# Patient Record
Sex: Female | Born: 1950 | Race: White | Hispanic: No | Marital: Married | Smoking: Never smoker
Health system: Southern US, Community
[De-identification: ages and names within clinical notes are randomized; demographics above are authoritative.]

## PROBLEM LIST (undated history)

## (undated) DIAGNOSIS — F329 Major depressive disorder, single episode, unspecified: Secondary | ICD-10-CM

## (undated) DIAGNOSIS — M8580 Other specified disorders of bone density and structure, unspecified site: Secondary | ICD-10-CM

## (undated) DIAGNOSIS — M19022 Primary osteoarthritis, left elbow: Secondary | ICD-10-CM

## (undated) DIAGNOSIS — T7840XA Allergy, unspecified, initial encounter: Secondary | ICD-10-CM

## (undated) DIAGNOSIS — L509 Urticaria, unspecified: Secondary | ICD-10-CM

## (undated) DIAGNOSIS — F419 Anxiety disorder, unspecified: Secondary | ICD-10-CM

## (undated) DIAGNOSIS — F32A Depression, unspecified: Secondary | ICD-10-CM

## (undated) DIAGNOSIS — D649 Anemia, unspecified: Secondary | ICD-10-CM

## (undated) DIAGNOSIS — L309 Dermatitis, unspecified: Secondary | ICD-10-CM

## (undated) DIAGNOSIS — K219 Gastro-esophageal reflux disease without esophagitis: Secondary | ICD-10-CM

## (undated) DIAGNOSIS — Z9889 Other specified postprocedural states: Secondary | ICD-10-CM

## (undated) DIAGNOSIS — E785 Hyperlipidemia, unspecified: Secondary | ICD-10-CM

## (undated) DIAGNOSIS — G510 Bell's palsy: Secondary | ICD-10-CM

## (undated) DIAGNOSIS — N83209 Unspecified ovarian cyst, unspecified side: Secondary | ICD-10-CM

## (undated) DIAGNOSIS — R112 Nausea with vomiting, unspecified: Secondary | ICD-10-CM

## (undated) DIAGNOSIS — R011 Cardiac murmur, unspecified: Secondary | ICD-10-CM

## (undated) DIAGNOSIS — S2220XA Unspecified fracture of sternum, initial encounter for closed fracture: Secondary | ICD-10-CM

## (undated) HISTORY — PX: COLONOSCOPY: SHX174

## (undated) HISTORY — PX: POLYPECTOMY: SHX149

## (undated) HISTORY — DX: Gastro-esophageal reflux disease without esophagitis: K21.9

## (undated) HISTORY — PX: OTHER SURGICAL HISTORY: SHX169

## (undated) HISTORY — DX: Depression, unspecified: F32.A

## (undated) HISTORY — DX: Unspecified ovarian cyst, unspecified side: N83.209

## (undated) HISTORY — DX: Allergy, unspecified, initial encounter: T78.40XA

## (undated) HISTORY — DX: Anxiety disorder, unspecified: F41.9

## (undated) HISTORY — PX: ABDOMINAL HYSTERECTOMY: SHX81

## (undated) HISTORY — DX: Hyperlipidemia, unspecified: E78.5

## (undated) HISTORY — DX: Other specified disorders of bone density and structure, unspecified site: M85.80

## (undated) HISTORY — DX: Urticaria, unspecified: L50.9

## (undated) HISTORY — DX: Cardiac murmur, unspecified: R01.1

## (undated) HISTORY — PX: TONSILLECTOMY: SUR1361

## (undated) HISTORY — DX: Major depressive disorder, single episode, unspecified: F32.9

---

## 2001-11-24 DIAGNOSIS — F419 Anxiety disorder, unspecified: Secondary | ICD-10-CM

## 2001-11-24 HISTORY — DX: Anxiety disorder, unspecified: F41.9

## 2003-07-28 ENCOUNTER — Encounter: Payer: Self-pay | Admitting: Gastroenterology

## 2004-05-15 ENCOUNTER — Encounter: Payer: Self-pay | Admitting: Internal Medicine

## 2004-05-24 ENCOUNTER — Encounter: Admission: RE | Admit: 2004-05-24 | Discharge: 2004-05-24 | Payer: Self-pay | Admitting: Internal Medicine

## 2005-05-15 ENCOUNTER — Ambulatory Visit: Payer: Self-pay | Admitting: Internal Medicine

## 2005-05-26 ENCOUNTER — Encounter: Admission: RE | Admit: 2005-05-26 | Discharge: 2005-05-26 | Payer: Self-pay | Admitting: Internal Medicine

## 2005-12-06 ENCOUNTER — Emergency Department: Payer: Self-pay | Admitting: Emergency Medicine

## 2005-12-10 ENCOUNTER — Ambulatory Visit: Payer: Self-pay | Admitting: Internal Medicine

## 2005-12-16 ENCOUNTER — Ambulatory Visit: Payer: Self-pay | Admitting: Internal Medicine

## 2006-04-14 ENCOUNTER — Encounter: Payer: Self-pay | Admitting: Internal Medicine

## 2006-04-14 ENCOUNTER — Emergency Department (HOSPITAL_COMMUNITY): Admission: EM | Admit: 2006-04-14 | Discharge: 2006-04-14 | Payer: Self-pay | Admitting: Family Medicine

## 2006-04-28 ENCOUNTER — Ambulatory Visit: Payer: Self-pay | Admitting: Internal Medicine

## 2006-05-18 ENCOUNTER — Ambulatory Visit: Payer: Self-pay | Admitting: Internal Medicine

## 2006-07-07 ENCOUNTER — Encounter: Admission: RE | Admit: 2006-07-07 | Discharge: 2006-07-07 | Payer: Self-pay | Admitting: Internal Medicine

## 2006-08-20 ENCOUNTER — Ambulatory Visit: Payer: Self-pay | Admitting: Internal Medicine

## 2006-09-17 ENCOUNTER — Ambulatory Visit: Payer: Self-pay | Admitting: Internal Medicine

## 2006-11-10 ENCOUNTER — Ambulatory Visit: Payer: Self-pay | Admitting: Internal Medicine

## 2007-02-18 ENCOUNTER — Encounter: Payer: Self-pay | Admitting: Internal Medicine

## 2007-02-18 DIAGNOSIS — K219 Gastro-esophageal reflux disease without esophagitis: Secondary | ICD-10-CM | POA: Insufficient documentation

## 2007-02-18 DIAGNOSIS — M8580 Other specified disorders of bone density and structure, unspecified site: Secondary | ICD-10-CM | POA: Insufficient documentation

## 2007-02-18 DIAGNOSIS — L300 Nummular dermatitis: Secondary | ICD-10-CM | POA: Insufficient documentation

## 2007-02-18 DIAGNOSIS — F39 Unspecified mood [affective] disorder: Secondary | ICD-10-CM | POA: Insufficient documentation

## 2007-03-12 ENCOUNTER — Ambulatory Visit: Payer: Self-pay | Admitting: Internal Medicine

## 2007-08-11 ENCOUNTER — Encounter: Payer: Self-pay | Admitting: Internal Medicine

## 2007-08-17 ENCOUNTER — Encounter (INDEPENDENT_AMBULATORY_CARE_PROVIDER_SITE_OTHER): Payer: Self-pay | Admitting: *Deleted

## 2007-09-01 ENCOUNTER — Encounter (INDEPENDENT_AMBULATORY_CARE_PROVIDER_SITE_OTHER): Payer: Self-pay | Admitting: *Deleted

## 2007-09-15 ENCOUNTER — Ambulatory Visit: Payer: Self-pay | Admitting: Internal Medicine

## 2007-09-15 DIAGNOSIS — J309 Allergic rhinitis, unspecified: Secondary | ICD-10-CM | POA: Insufficient documentation

## 2007-09-15 DIAGNOSIS — R109 Unspecified abdominal pain: Secondary | ICD-10-CM | POA: Insufficient documentation

## 2007-09-16 ENCOUNTER — Encounter: Payer: Self-pay | Admitting: Internal Medicine

## 2007-09-16 LAB — CONVERTED CEMR LAB
ALT: 17 units/L (ref 0–35)
Amylase: 85 units/L (ref 27–131)
Basophils Absolute: 0 10*3/uL (ref 0.0–0.1)
Bilirubin, Direct: 0.1 mg/dL (ref 0.0–0.3)
Chloride: 105 meq/L (ref 96–112)
Creatinine, Ser: 0.7 mg/dL (ref 0.4–1.2)
Eosinophils Relative: 1.4 % (ref 0.0–5.0)
GFR calc Af Amer: 111 mL/min
GFR calc non Af Amer: 92 mL/min
Lipase: 22 units/L (ref 11.0–59.0)
Lymphocytes Relative: 33.4 % (ref 12.0–46.0)
MCHC: 34.6 g/dL (ref 30.0–36.0)
Monocytes Relative: 10.8 % (ref 3.0–11.0)
Neutro Abs: 3 10*3/uL (ref 1.4–7.7)
Phosphorus: 3.6 mg/dL (ref 2.3–4.6)
Platelets: 268 10*3/uL (ref 150–400)
Potassium: 4.8 meq/L (ref 3.5–5.1)
RBC: 3.94 M/uL (ref 3.87–5.11)
Total Bilirubin: 0.9 mg/dL (ref 0.3–1.2)

## 2007-10-26 ENCOUNTER — Telehealth (INDEPENDENT_AMBULATORY_CARE_PROVIDER_SITE_OTHER): Payer: Self-pay | Admitting: *Deleted

## 2008-02-29 ENCOUNTER — Ambulatory Visit: Payer: Self-pay | Admitting: Internal Medicine

## 2008-02-29 DIAGNOSIS — S20219A Contusion of unspecified front wall of thorax, initial encounter: Secondary | ICD-10-CM | POA: Insufficient documentation

## 2008-09-19 ENCOUNTER — Ambulatory Visit: Payer: Self-pay | Admitting: Internal Medicine

## 2008-09-19 DIAGNOSIS — M549 Dorsalgia, unspecified: Secondary | ICD-10-CM | POA: Insufficient documentation

## 2008-09-19 DIAGNOSIS — R002 Palpitations: Secondary | ICD-10-CM | POA: Insufficient documentation

## 2008-10-11 ENCOUNTER — Encounter: Payer: Self-pay | Admitting: Internal Medicine

## 2008-10-23 ENCOUNTER — Encounter (INDEPENDENT_AMBULATORY_CARE_PROVIDER_SITE_OTHER): Payer: Self-pay | Admitting: *Deleted

## 2009-01-31 ENCOUNTER — Telehealth (INDEPENDENT_AMBULATORY_CARE_PROVIDER_SITE_OTHER): Payer: Self-pay | Admitting: *Deleted

## 2009-04-03 ENCOUNTER — Telehealth: Payer: Self-pay | Admitting: Internal Medicine

## 2009-09-21 ENCOUNTER — Ambulatory Visit: Payer: Self-pay | Admitting: Internal Medicine

## 2009-09-21 DIAGNOSIS — E785 Hyperlipidemia, unspecified: Secondary | ICD-10-CM | POA: Insufficient documentation

## 2009-09-24 LAB — CONVERTED CEMR LAB
ALT: 19 units/L (ref 0–35)
AST: 18 units/L (ref 0–37)
Albumin: 4.1 g/dL (ref 3.5–5.2)
Alkaline Phosphatase: 60 units/L (ref 39–117)
Basophils Absolute: 0 10*3/uL (ref 0.0–0.1)
Bilirubin, Direct: 0 mg/dL (ref 0.0–0.3)
Chloride: 100 meq/L (ref 96–112)
Cholesterol: 338 mg/dL — ABNORMAL HIGH (ref 0–200)
Glucose, Bld: 97 mg/dL (ref 70–99)
HDL: 42.9 mg/dL (ref >39.00)
Hemoglobin: 13.6 g/dL (ref 12.0–15.0)
Lymphocytes Relative: 38.2 % (ref 12.0–46.0)
Monocytes Absolute: 0.5 10*3/uL (ref 0.1–1.0)
Neutro Abs: 2.6 10*3/uL (ref 1.4–7.7)
Neutrophils Relative %: 49.7 % (ref 43.0–77.0)
Phosphorus: 3.6 mg/dL (ref 2.3–4.6)
Sodium: 141 meq/L (ref 135–145)
TSH: 3.1 microintl units/mL (ref 0.35–5.50)
Total Protein: 7.8 g/dL (ref 6.0–8.3)
Triglycerides: 148 mg/dL (ref 0.0–149.0)
VLDL: 29.6 mg/dL (ref 0.0–40.0)
WBC: 5.1 10*3/uL (ref 4.5–10.5)

## 2009-10-24 ENCOUNTER — Encounter: Payer: Self-pay | Admitting: Internal Medicine

## 2009-11-01 ENCOUNTER — Encounter: Payer: Self-pay | Admitting: Internal Medicine

## 2009-12-06 ENCOUNTER — Telehealth: Payer: Self-pay | Admitting: Internal Medicine

## 2010-09-18 ENCOUNTER — Ambulatory Visit: Payer: Self-pay | Admitting: Internal Medicine

## 2010-09-18 DIAGNOSIS — T7840XA Allergy, unspecified, initial encounter: Secondary | ICD-10-CM | POA: Insufficient documentation

## 2010-09-23 ENCOUNTER — Ambulatory Visit: Payer: Self-pay | Admitting: Internal Medicine

## 2010-09-24 ENCOUNTER — Encounter (INDEPENDENT_AMBULATORY_CARE_PROVIDER_SITE_OTHER): Payer: Self-pay | Admitting: *Deleted

## 2010-09-24 LAB — CONVERTED CEMR LAB
AST: 18 units/L (ref 0–37)
Alkaline Phosphatase: 57 units/L (ref 39–117)
Basophils Absolute: 0 10*3/uL (ref 0.0–0.1)
Calcium: 9.9 mg/dL (ref 8.4–10.5)
Chloride: 95 meq/L — ABNORMAL LOW (ref 96–112)
Cholesterol: 303 mg/dL — ABNORMAL HIGH (ref 0–200)
Creatinine, Ser: 0.6 mg/dL (ref 0.4–1.2)
Direct LDL: 197.5 mg/dL
Eosinophils Relative: 1 % (ref 0.0–5.0)
GFR calc non Af Amer: 104.12 mL/min (ref >60)
Lymphs Abs: 2 10*3/uL (ref 0.7–4.0)
MCHC: 33.5 g/dL (ref 30.0–36.0)
Neutro Abs: 3.9 10*3/uL (ref 1.4–7.7)
Potassium: 4.6 meq/L (ref 3.5–5.1)
RBC: 3.98 M/uL (ref 3.87–5.11)
RDW: 13 % (ref 11.5–14.6)
Sodium: 139 meq/L (ref 135–145)
TSH: 2.59 microintl units/mL (ref 0.35–5.50)
Total Bilirubin: 0.6 mg/dL (ref 0.3–1.2)
Total CHOL/HDL Ratio: 7
Triglycerides: 371 mg/dL — ABNORMAL HIGH (ref 0.0–149.0)
VLDL: 74.2 mg/dL — ABNORMAL HIGH (ref 0.0–40.0)

## 2010-10-04 ENCOUNTER — Encounter (INDEPENDENT_AMBULATORY_CARE_PROVIDER_SITE_OTHER): Payer: Self-pay | Admitting: *Deleted

## 2010-10-08 ENCOUNTER — Ambulatory Visit: Payer: Self-pay | Admitting: Gastroenterology

## 2010-10-09 ENCOUNTER — Telehealth: Payer: Self-pay | Admitting: Gastroenterology

## 2010-10-14 ENCOUNTER — Telehealth (INDEPENDENT_AMBULATORY_CARE_PROVIDER_SITE_OTHER): Payer: Self-pay | Admitting: *Deleted

## 2010-10-25 ENCOUNTER — Ambulatory Visit: Payer: Self-pay | Admitting: Gastroenterology

## 2010-10-25 LAB — HM COLONOSCOPY

## 2010-10-29 ENCOUNTER — Encounter: Payer: Self-pay | Admitting: Gastroenterology

## 2010-12-22 LAB — CONVERTED CEMR LAB
ALT: 20 units/L (ref 0–35)
Alkaline Phosphatase: 53 units/L (ref 39–117)
Basophils Absolute: 0 10*3/uL (ref 0.0–0.1)
Bilirubin, Direct: 0.1 mg/dL (ref 0.0–0.3)
CO2: 30 meq/L (ref 19–32)
Calcium: 9.8 mg/dL (ref 8.4–10.5)
Creatinine, Ser: 0.7 mg/dL (ref 0.4–1.2)
Direct LDL: 219.8 mg/dL
Eosinophils Absolute: 0 10*3/uL (ref 0.0–0.7)
Eosinophils Relative: 0.7 % (ref 0.0–5.0)
GFR calc non Af Amer: 91 mL/min
Hemoglobin: 14.3 g/dL (ref 12.0–15.0)
Lymphocytes Relative: 35.9 % (ref 12.0–46.0)
MCHC: 35.3 g/dL (ref 30.0–36.0)
MCV: 95.3 fL (ref 78.0–100.0)
Monocytes Absolute: 0.6 10*3/uL (ref 0.1–1.0)
Neutro Abs: 2.9 10*3/uL (ref 1.4–7.7)
Phosphorus: 3.5 mg/dL (ref 2.3–4.6)
Platelets: 246 10*3/uL (ref 150–400)
RBC: 4.25 M/uL (ref 3.87–5.11)
TSH: 2.79 microintl units/mL (ref 0.35–5.50)
Total CHOL/HDL Ratio: 7.1
Total Protein: 7.2 g/dL (ref 6.0–8.3)
Triglycerides: 120 mg/dL (ref 0–149)

## 2010-12-25 NOTE — Assessment & Plan Note (Signed)
Summary: EYE/CLE   Vital Signs:  Patient profile:   60 year old female Height:      65 inches Weight:      143 pounds BMI:     23.88 Temp:     98.6 degrees F oral BP sitting:   108 / 68  (left arm) Cuff size:   regular  Vitals Entered By: Mervin Hack CMA Duncan Dull) (September 18, 2010 10:27 AM) CC: rash over left eye   History of Present Illness: Has had rash over eyes which started as lump over eye about 1 month ago noted allergic reaction then with runny nose, etc has had swelling and redness since then has tried various creams and vaseline without relief some crusting around eye at first  Dr Mayford Knife has diagnosed nummular eczema All creams have run out  Allergies: 1)  ! Penicillin 2)  ! Zocor 3)  ! Lipitor 4)  ! * Nexium 5)  ! Prilosec 6)  ! * Red Yeast Rice  Past History:  Past medical, surgical, family and social histories (including risk factors) reviewed for relevance to current acute and chronic problems.  Past Medical History: Reviewed history from 09/21/2009 and no changes required. Depression 2003 GERD 2000 Osteopenia 2001 Hyperlipidemia  Past Surgical History: Reviewed history from 02/18/2007 and no changes required. 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 Dad died @73  DM complications Mom alive @91  Vaginal cancer,hyperchol,HTN Brother with colon cancer at 39. Still alive in 54's 1 sister--HTN,DM, ?heart trouble 1 other brother in 77's Mat GF had MI/CVA  Social History: Reviewed history from 02/18/2007 and no changes required. Occupation:  homemaker Married--2 sons Never Smoked Alcohol use-yes Regular exercise-yes  Review of Systems       has sense of inner ear issue on left No change in eye make up--trying to avoid cosmetics on eye for now  Physical Exam  General:  alert and normal appearance.     Eyes:  pupils equal, pupils round, and pupils reactive to light.  Mild edema and erythema in upper lids and up to eyebrows No conjunctival injeciton or inflammation Not warm or tender   Impression & Recommendations:  Problem # 1:  ALLERGIC REACTION (ICD-995.3) Assessment New no evidence of infection Eye is quiet  will try cetirizine triamcinolone cream briefly will recheck at PE next week  Complete Medication List: 1)  Sertraline Hcl 50 Mg Tabs (Sertraline hcl) .Marland Kitchen.. 1 tablet by mouth once a day 2)  Calcium-vitamin D 600-200 Mg-unit Tabs (Calcium-vitamin d) .... Take 1 by mouth two times a day 3)  Fish Oil 1000 Mg Caps (Omega-3 fatty acids) .... Take 1 by mouth once daily 4)  Prevacid 24hr 15 Mg Cpdr (Lansoprazole) .Marland Kitchen.. 1 tab daily as needed for acid symptoms 5)  Triamcinolone Acetonide 0.025 % Crea (Triamcinolone acetonide) .... Apply to rash over eyes three times a day till clear  Patient Instructions: 1)  Please use cetirizine 10mg  daily till the itching and swelling is gone over eyes 2)  Please keep next week's appt Prescriptions: TRIAMCINOLONE ACETONIDE 0.025 % CREA (TRIAMCINOLONE ACETONIDE) apply to rash over eyes three times a day till clear  #15gm x 1   Entered and Authorized by:   Cindee Salt MD   Signed by:   Cindee Salt MD on 09/18/2010   Method used:   [facility] [id number]       [address]       [place], [place]  [address]       Ph: [phone] or [phone]       Fax: [phone]   RxID:   [id number]    Orders Added: 1)  Est. Patient Level III [56387]    Current Allergies (reviewed today): ! PENICILLIN ! ZOCOR ! LIPITOR ! * NEXIUM ! PRILOSEC ! * RED YEAST RICE

## 2010-12-25 NOTE — Letter (Signed)
Summary: Pre Visit Letter Revised  [facility]  [address] [place], [place] [address]   Phone: [phone]  Fax: [phone]        09/24/2010 MRN: [id number] [name] [address] [place], [place]  [address]             Procedure Date:  10/25/2010  Welcome to the Gastroenterology Division at [facility].    You are scheduled to see a nurse for your pre-procedure visit on 10/08/2010 at 10:00AM on the 3rd floor at [facility], [address]  We ask that you try to arrive at our office 15 minutes prior to your appointment time to allow for check-in.  Please take a minute to review the attached form.  If you answer "Yes" to one or more of the questions on the first page, we ask that you call the person listed at your earliest opportunity.  If you answer "No" to all of the questions, please complete the rest of the form and bring it to your appointment.    Your nurse visit will consist of discussing your medical and surgical history, your immediate family medical history, and your medications.   If you are unable to list all of your medications on the form, please bring the medication bottles to your appointment and we will list them.  We will need to be aware of both prescribed and over the counter drugs.  We will need to know exact dosage information as well.    Please be prepared to read and sign documents such as consent forms, a financial agreement, and acknowledgement forms.  If necessary, and with your consent, a friend or relative is welcome to sit-in on the nurse visit with you.  Please bring your insurance card so that we may make a copy of it.  If your insurance requires a referral to see a specialist, please bring your referral form from your primary care physician.  No co-pay is required for this nurse visit.     If you cannot keep your appointment, please call [phone] to cancel or reschedule prior to your appointment date.  This  allows [place] the opportunity to schedule an appointment for another patient in need of care.    Thank you for choosing [facility] for your medical needs.  We appreciate the opportunity to care for you.  Please visit [place] at our website  to learn more about our practice.  Sincerely, The Gastroenterology Division

## 2010-12-25 NOTE — Miscellaneous (Signed)
Summary: [facility]  Clinical Lists Changes  Medications: Added new medication of MOVIPREP 100 GM  SOLR (PEG-KCL-NACL-NASULF-NA ASC-C) As per prep instructions. - Signed Rx of MOVIPREP 100 GM  SOLR (PEG-KCL-NACL-NASULF-NA ASC-C) As per prep instructions.;  #1 x 0;  Signed;  Entered by: Ezra Sites RN;  Authorized by: Mardella Layman MD [facility];  Method used: [facility] [id number] [address], [place], [place]  [address], Ph: [phone] or [phone], Fax: [phone] Allergies: Changed allergy or adverse reaction from PENICILLIN to PENICILLIN Changed allergy or adverse reaction from ZOCOR to ZOCOR Changed allergy or adverse reaction from LIPITOR to LIPITOR Removed allergy or adverse reaction of * NEXIUM Removed allergy or adverse reaction of PRILOSEC Changed allergy or adverse reaction from * RED YEAST RICE to * RED YEAST RICE    Prescriptions: MOVIPREP 100 GM  SOLR (PEG-KCL-NACL-NASULF-NA ASC-C) As per prep instructions.  #1 x 0   Entered by:   Ezra Sites RN   Authorized by:   Mardella Layman MD [facility]   Signed by:   Ezra Sites RN on 10/08/2010   Method used:   [facility]        [facility] (retail)       [address]       [place], [place]  [address]       Ph: [phone] or [phone]       Fax: [phone]   RxID:   [id number]

## 2010-12-25 NOTE — Letter (Signed)
Summary: [name] Instructions  [facility]  [address] [place], [place] [address]   Phone: [phone]  Fax: [phone]       LANDRY LOOKINGBILL    [birth year]    MRN: [id number]        Procedure Day /Date: Friday 10-25-10     Arrival Time: 12:30 pm     Procedure Time: 1:30 pm     Location of Procedure:                    _x _  [facility] (4th Floor)   PREPARATION FOR COLONOSCOPY WITH MOVIPREP   Starting 5 days prior to your procedure  10-20-10 do not eat nuts, seeds, popcorn, corn, beans, peas,  salads, or any raw vegetables.  Do not take any fiber supplements (e.g. Metamucil, Citrucel, and Benefiber).  THE DAY BEFORE YOUR PROCEDURE         DATE: Thursday   DAY:  10-24-10  1.  Drink clear liquids the entire day-NO SOLID FOOD  2.  Do not drink anything colored red or purple.  Avoid juices with pulp.  No orange juice.  3.  Drink at least 64 oz. (8 glasses) of fluid/clear liquids during the day to prevent dehydration and help the prep work efficiently.  CLEAR LIQUIDS INCLUDE: Water Jello Ice Popsicles Tea (sugar ok, no milk/cream) Powdered fruit flavored drinks Coffee (sugar ok, no milk/cream) Gatorade Juice: apple, white grape, white cranberry  Lemonade Clear bullion, consomm, broth Carbonated beverages (any kind) Strained chicken noodle soup Hard Candy                             4.  In the morning, mix first dose of MoviPrep solution:    Empty 1 Pouch A and 1 Pouch B into the disposable container    Add lukewarm drinking water to the top line of the container. Mix to dissolve    Refrigerate (mixed solution should be used within 24 hrs)  5.  Begin drinking the prep at 5:00 p.m. The MoviPrep container is divided by 4 marks.   Every 15 minutes drink the solution down to the next mark (approximately 8 oz) until the full liter is complete.   6.  Follow completed prep with 16 oz of clear liquid of your choice (Nothing red or purple).   Continue to drink clear liquids until bedtime.  7.  Before going to bed, mix second dose of MoviPrep solution:    Empty 1 Pouch A and 1 Pouch B into the disposable container    Add lukewarm drinking water to the top line of the container. Mix to dissolve    Refrigerate  THE DAY OF YOUR PROCEDURE      DATE:  10-25-10  DAY:  Friday  Beginning at  9:30 a.m. (5 hours before procedure):         1. Every 15 minutes, drink the solution down to the next mark (approx 8 oz) until the full liter is complete.  2. Follow completed prep with 16 oz. of clear liquid of your choice.    3. You may drink clear liquids until  11:30 a.m.  (2 HOURS BEFORE PROCEDURE).   MEDICATION INSTRUCTIONS  Unless otherwise instructed, you should take regular prescription medications with a small sip of water   as early as possible the morning of your procedure.  OTHER INSTRUCTIONS  You will need a responsible adult at least 61 years of age to accompany you and drive you home.   This person must remain in the waiting room during your procedure.  Wear loose fitting clothing that is easily removed.  Leave jewelry and other valuables at home.  However, you may wish to bring a book to read or  an iPod/MP3 player to listen to music as you wait for your procedure to start.  Remove all body piercing jewelry and leave at home.  Total time from sign-in until discharge is approximately 2-3 hours.  You should go home directly after your procedure and rest.  You can resume normal activities the  day after your procedure.  The day of your procedure you should not:   Drive   Make legal decisions   Operate machinery   Drink alcohol   Return to work  You will receive specific instructions about eating, activities and medications before you leave.    The above instructions have been reviewed and explained to me by   Ezra Sites RN  October 08, 2010 10:30 AM     I fully understand and can  verbalize these instructions _____________________________ Date _________

## 2010-12-25 NOTE — Procedures (Signed)
Summary: Colonoscopy  Patient: Harlene Petralia Note: All result statuses are Final unless otherwise noted.  Tests: (1) Colonoscopy (COL)   COL Colonoscopy           DONE     [facility]     [address]     [place], [place]  [address]           COLONOSCOPY PROCEDURE REPORT           PATIENT:  Raniah, Karan  MR#:  [id number]     BIRTHDATE:  22-Sep-1951, 59 yrs. old  GENDER:  female     ENDOSCOPIST:  Vania Rea. Jarold Motto, MD, [facility]     REF. BY:  Tillman Abide, M.D.     PROCEDURE DATE:  10/25/2010     PROCEDURE:  Colonoscopy with biopsy     ASA CLASS:  Class II     INDICATIONS:  Elevated Risk Screening     MEDICATIONS:   Fentanyl 100 mcg IV, Versed 9 mg           DESCRIPTION OF PROCEDURE:   After the risks benefits and     alternatives of the procedure were thoroughly explained, informed     consent was obtained.  Digital rectal exam was performed and     revealed no abnormalities.   The LB [id number]-[id number] endoscope     was introduced through the anus and advanced to the cecum, which     was identified by both the appendix and ileocecal valve, limited     by a redundant colon.  ADHESIONS.USE PEDIATRIC SCOPE.  The quality     of the prep was adequate, using MoviPrep.  The instrument was then     slowly withdrawn as the colon was fully examined.     <<PROCEDUREIMAGES>>           FINDINGS:  No polyps or cancers were seen.  mucosal abnormality.     NODULAR IC VALVE.BIOPSIED.  This was otherwise a normal     examination of the colon.   Retroflexed views in the rectum     revealed no abnormalities.    The scope was then withdrawn from     the patient and the procedure completed.           COMPLICATIONS:  None     ENDOSCOPIC IMPRESSION:     1) No polyps or cancers     2) Mucosal abnormality     3) Otherwise normal examination     RECOMMENDATIONS:     1) Given your significant family history of colon cancer, you     should have a repeat colonoscopy in 5 years     REPEAT  EXAM:  No           ______________________________     Vania Rea. Jarold Motto, MD, [facility]           CC:           n.     eSIGNED:   Vania Rea. Patterson at 10/25/2010 02:33 PM           Philis Kendall, [id number]  Note: An exclamation mark (!) indicates a result that was not dispersed into the flowsheet. Document Creation Date: 10/25/2010 2:33 PM _______________________________________________________________________  (1) Order result status: Final Collection or observation date-time: 10/25/2010 14:23 Requested date-time:  Receipt date-time:  Reported date-time:  Referring Physician:   Ordering Physician: Sheryn Bison [phone]) Specimen Source:  Source: Launa Grill Order Number: [id number] Lab  site:   Appended Document: Colonoscopy     Procedures Next Due Date:    Colonoscopy: 10/2015

## 2010-12-25 NOTE — Letter (Signed)
Summary: Patient Notice- Polyp Results  [facility]  [address] [place], [place] [address]   Phone: [phone]  Fax: [phone]        October 29, 2010 MRN: [id number]    LORENDA GRECCO [address] [place], [place]  [address]    Dear Ms. Excell Seltzer,  I am pleased to inform you that the colon polyp(s) removed during your recent colonoscopy was (were) found to be benign (no cancer detected) upon pathologic examination.The biopsy was completely benign. I would recommend 5 year followup because of your family history.  I recommend you have a repeat colonoscopy examination in 5_ years to look for recurrent polyps, as having colon polyps increases your risk for having recurrent polyps or even colon cancer in the future.  Should you develop new or worsening symptoms of abdominal pain, bowel habit changes or bleeding from the rectum or bowels, please schedule an evaluation with either your primary care physician or with me.  Additional information/recommendations:  xx__ No further action with gastroenterology is needed at this time. Please      follow-up with your primary care physician for your other healthcare      needs.  __ Please call [phone] to schedule a return visit to review your      situation.  __ Please keep your follow-up visit as already scheduled.  __ Continue treatment plan as outlined the day of your exam.  Please call us if you are having persistent problems or have questions about your condition that have not been fully answered at this time.  Sincerely,  Mardella Layman MD [facility]  This letter has been electronically signed by your physician.  Appended Document: Patient Notice- Polyp Results Letter mailed

## 2010-12-25 NOTE — Progress Notes (Signed)
  Phone Note Other Incoming   Request: Send information Summary of Call: Received lab records from [facility], 3pages sent up to Dr. Jarold Motto

## 2010-12-25 NOTE — Assessment & Plan Note (Signed)
Summary: CPX/DLO   Vital Signs:  Patient profile:   60 year old female Menstrual status:  hysterectomy Weight:      144 pounds Temp:     99.0 degrees F oral Pulse rate:   72 / minute Pulse rhythm:   regular BP sitting:   112 / 70  (left arm) Cuff size:   regular  Vitals Entered By: Mervin Hack CMA Duncan Dull) (September 23, 2010 12:00 PM) CC: adult physical     Menstrual Status hysterectomy   History of Present Illness: Eyes are better but not back to normal had 99.5 this weekend  No new concerns has been good Has tried to wean the sertraline somewhat---now down 6 days per week  Allergies: 1)  ! Penicillin 2)  ! Zocor 3)  ! Lipitor 4)  ! * Nexium 5)  ! Prilosec 6)  ! * Red Yeast Rice  Past History:  Past medical, surgical, family and social histories (including risk factors) reviewed for relevance to current acute and chronic problems.  Past Medical History: Reviewed history from 09/21/2009 and no changes required. Depression 2003 GERD 2000 Osteopenia 2001 Hyperlipidemia  Past Surgical History: Reviewed history from 02/18/2007 and no changes required. 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 Dad died @73  DM complications Mom alive @91  Vaginal cancer,hyperchol,HTN Half brother with colon cancer at 57. Still alive in 69's 1 sister--HTN,DM, ?heart trouble 1 other brother in 58's Mat GF had MI/CVA  Social History: Reviewed history from 02/18/2007 and no changes required. Occupation:  homemaker Married--2 sons Never Smoked Alcohol use-yes Regular exercise-yes  Review of Systems General:  tries to work out regularly Mom now in assisted living---had been down in [place] with her for a month weight fairly stable sleeps okay wears seat belt. Eyes:  Denies double vision and vision loss-1 eye. ENT:  Denies decreased  hearing and ringing in ears; teeth fine--regular with dentist. CV:  Denies chest pain or discomfort, difficulty breathing at night, difficulty breathing while lying down, fainting, lightheadness, palpitations, and shortness of breath with exertion. Resp:  Denies cough and shortness of breath. GI:  Complains of indigestion; denies abdominal pain, bloody stools, change in bowel habits, dark tarry stools, nausea, and vomiting; occ heartburn--uses prevacid or tums as needed . GU:  Denies dysuria and incontinence; no sexual problems self breast exam fine. MS:  Denies joint pain and joint swelling. Derm:  Complains of dryness; denies lesion(s) and rash. Neuro:  Denies headaches, numbness, tingling, and weakness. Psych:  Denies anxiety and depression. Endo:  Denies cold intolerance and heat intolerance. Heme:  Denies abnormal bruising and enlarge lymph nodes. Allergy:  Complains of seasonal allergies and sneezing; generally doesn't use meds.  Physical Exam  General:  alert and normal appearance.   Eyes:  pupils equal, pupils round, pupils reactive to light, and no optic disk abnormalities.   Ears:  R ear normal and L ear normal.   Mouth:  no erythema, no exudates, and no lesions.   Neck:  supple, no masses, no thyromegaly, no carotid bruits, and no cervical lymphadenopathy.   Breasts:  no abnormal thickening, no tenderness, and no adenopathy.  Bilateral cystic changes Lungs:  normal respiratory effort, no intercostal retractions, no accessory muscle use, and normal breath sounds.   Heart:  normal rate, regular rhythm, no murmur, and no gallop.   Abdomen:  soft, non-tender, and no masses.  Msk:  no joint tenderness and no joint swelling.   Pulses:  feet warm but no palpable pulses Extremities:  no sig edema Neurologic:  alert & oriented X3, strength normal in all extremities, and gait normal.   Skin:  no suspicious lesions and no ulcerations.   Psych:  normally interactive, good eye contact,  not anxious appearing, and not depressed appearing.     Impression & Recommendations:  Problem # 1:  PREVENTIVE HEALTH CARE (ICD-V70.0) Assessment Comment Only doing well will get back to exercise schedule due for colonoscopy will wait till next year for mammo (2 year intervals)  Problem # 2:  DEPRESSION (ICD-311) Assessment: Improved will go ahead with slow wean  Her updated medication list for this problem includes:    Sertraline Hcl 50 Mg Tabs (Sertraline hcl) .Marland Kitchen... 1 tablet by mouth once a day  Problem # 3:  GERD (ICD-530.81) Assessment: Comment Only  as needed meds only  Her updated medication list for this problem includes:    Prevacid 24hr 15 Mg Cpdr (Lansoprazole) .Marland Kitchen... 1 tab daily as needed for acid symptoms  Orders: TLB-Renal Function Panel (80069-RENAL) TLB-CBC Platelet - w/Differential (85025-CBCD) TLB-TSH (Thyroid Stimulating Hormone) (84443-TSH)  Complete Medication List: 1)  Sertraline Hcl 50 Mg Tabs (Sertraline hcl) .Marland Kitchen.. 1 tablet by mouth once a day 2)  Calcium-vitamin D 600-200 Mg-unit Tabs (Calcium-vitamin d) .... Take 1 by mouth two times a day 3)  Fish Oil 1000 Mg Caps (Omega-3 fatty acids) .... Take 1 by mouth once daily 4)  Prevacid 24hr 15 Mg Cpdr (Lansoprazole) .Marland Kitchen.. 1 tab daily as needed for acid symptoms 5)  Triamcinolone Acetonide 0.025 % Crea (Triamcinolone acetonide) .... Apply to rash over eyes three times a day till clear  Other Orders: Gastroenterology Referral (GI) TLB-Lipid Panel (80061-LIPID) TLB-Hepatic/Liver Function Pnl (80076-HEPATIC) Venipuncture (62952) Flu Vaccine 64yrs + (84132) Admin 1st Vaccine (44010)  Patient Instructions: 1)  Please schedule a follow-up appointment in 1 year.  2)  Schedule a colonoscopy/ sigmoidoscopy to help detect colon cancer.    Orders Added: 1)  Est. Patient 40-64 years [99396] 2)  Gastroenterology Referral [GI] 3)  TLB-Lipid Panel [80061-LIPID] 4)  TLB-Hepatic/Liver Function Pnl  [80076-HEPATIC] 5)  Venipuncture [36415] 6)  TLB-Renal Function Panel [80069-RENAL] 7)  TLB-CBC Platelet - w/Differential [85025-CBCD] 8)  TLB-TSH (Thyroid Stimulating Hormone) [84443-TSH] 9)  Flu Vaccine 20yrs + [90658] 10)  Admin 1st Vaccine [27253]   Immunizations Administered:  Influenza Vaccine # 1:    Vaccine Type: Fluvax 3+    Site: left deltoid    Mfr: GlaxoSmithKline    Dose: 0.5 ml    Route: IM    Given by: Mervin Hack CMA (AAMA)    Exp. Date: 05/24/2011    Lot #: [id number]    VIS given: 06/18/10 version given September 23, 2010.  Flu Vaccine Consent Questions:    Do you have a history of severe allergic reactions to this vaccine? no    Any prior history of allergic reactions to egg and/or gelatin? no    Do you have a sensitivity to the preservative Thimersol? no    Do you have a past history of Guillan-Barre Syndrome? no    Do you currently have an acute febrile illness? no    Have you ever had a severe reaction to latex? no    Vaccine information given and explained to patient? yes    Are you currently pregnant? no   Immunizations Administered:  Influenza Vaccine # 1:  Vaccine Type: Fluvax 3+    Site: left deltoid    Mfr: GlaxoSmithKline    Dose: 0.5 ml    Route: IM    Given by: Mervin Hack CMA (AAMA)    Exp. Date: 05/24/2011    Lot #: [id number]    VIS given: 06/18/10 version given September 23, 2010.  Current Allergies (reviewed today): ! PENICILLIN ! ZOCOR ! LIPITOR ! * NEXIUM ! PRILOSEC ! * RED YEAST RICE

## 2010-12-25 NOTE — Progress Notes (Signed)
Summary: RX Prevacid  Phone Note Call from Patient Call back at [phone]   Caller: Patient Call For: Cindee Salt MD Summary of Call: Patient needs a written rx for her Prevacid so that she can use her flex spending account to purchase this OTC. Call when rx is ready for pickup. Initial call taken by: Sydell Axon LPN,  December 06, 2009 11:39 AM  Follow-up for Phone Call        Rx written Follow-up by: Cindee Salt MD,  December 06, 2009 1:34 PM  Additional Follow-up for Phone Call Additional follow up Details #1::        Spoke with patient and advised rx ready for pick-up  Additional Follow-up by: Mervin Hack CMA Duncan Dull),  December 06, 2009 2:12 PM    New/Updated Medications: PREVACID 24HR 15 MG CPDR (LANSOPRAZOLE) 1 tab daily as needed for acid symptoms Prescriptions: PREVACID 24HR 15 MG CPDR (LANSOPRAZOLE) 1 tab daily as needed for acid symptoms  #30 x 12   Entered and Authorized by:   Cindee Salt MD   Signed by:   Cindee Salt MD on 12/06/2009   Method used:   Print then Give to Patient   RxID:   [id number]

## 2010-12-25 NOTE — Progress Notes (Signed)
Summary: Last COL  Phone Note Call from Patient Call back at Home Phone [phone]   Caller: Patient Call For: Dr. Jarold Motto Reason for Call: Talk to Nurse Summary of Call: Wanted to let you know she had her last COL at [facility] [phone] in 2001 Initial call taken by: Karna Christmas,  October 09, 2010 9:46 AM  Follow-up for Phone Call        Left a message on patients machine to call back, about the records. she signed a release in our office and i have faxed that to the listed office.  I will give them to Dr. Jarold Motto when we receive them. Follow-up by: Harlow Mares CMA Duncan Dull),  October 09, 2010 11:23 AM

## 2011-03-13 ENCOUNTER — Other Ambulatory Visit: Payer: Self-pay | Admitting: Internal Medicine

## 2011-08-01 ENCOUNTER — Telehealth: Payer: Self-pay | Admitting: *Deleted

## 2011-08-01 NOTE — Telephone Encounter (Signed)
Pt would like to get zostavax, she has checked with her insurance and it is covered here in office.

## 2011-08-01 NOTE — Telephone Encounter (Signed)
Okay to set up here at her convenience

## 2011-08-01 NOTE — Telephone Encounter (Signed)
Left message on machine at home for patient to call our office to schedule nurse visit to have Zostavax.

## 2011-08-05 ENCOUNTER — Ambulatory Visit (INDEPENDENT_AMBULATORY_CARE_PROVIDER_SITE_OTHER): Payer: 59 | Admitting: Internal Medicine

## 2011-08-05 DIAGNOSIS — Z23 Encounter for immunization: Secondary | ICD-10-CM | POA: Insufficient documentation

## 2011-08-05 NOTE — Progress Notes (Signed)
Zostavax given during nurse visit today.  

## 2011-09-24 ENCOUNTER — Encounter: Payer: Self-pay | Admitting: Internal Medicine

## 2011-09-25 ENCOUNTER — Ambulatory Visit (INDEPENDENT_AMBULATORY_CARE_PROVIDER_SITE_OTHER): Payer: 59 | Admitting: Internal Medicine

## 2011-09-25 ENCOUNTER — Encounter: Payer: Self-pay | Admitting: Internal Medicine

## 2011-09-25 VITALS — BP 120/66 | HR 82 | Temp 98.2°F | Ht 65.0 in | Wt 149.0 lb

## 2011-09-25 DIAGNOSIS — Z23 Encounter for immunization: Secondary | ICD-10-CM

## 2011-09-25 DIAGNOSIS — F3289 Other specified depressive episodes: Secondary | ICD-10-CM

## 2011-09-25 DIAGNOSIS — Z0000 Encounter for general adult medical examination without abnormal findings: Secondary | ICD-10-CM

## 2011-09-25 DIAGNOSIS — M899 Disorder of bone, unspecified: Secondary | ICD-10-CM

## 2011-09-25 DIAGNOSIS — Z1231 Encounter for screening mammogram for malignant neoplasm of breast: Secondary | ICD-10-CM

## 2011-09-25 DIAGNOSIS — K219 Gastro-esophageal reflux disease without esophagitis: Secondary | ICD-10-CM

## 2011-09-25 DIAGNOSIS — M949 Disorder of cartilage, unspecified: Secondary | ICD-10-CM

## 2011-09-25 DIAGNOSIS — F329 Major depressive disorder, single episode, unspecified: Secondary | ICD-10-CM

## 2011-09-25 NOTE — Assessment & Plan Note (Signed)
Healthy Discussed fitness Due for flu shot mammogram

## 2011-09-25 NOTE — Assessment & Plan Note (Signed)
Has been doing well without regular meds

## 2011-09-25 NOTE — Progress Notes (Signed)
Subjective:    Patient ID: [name], female    DOB: 03-28-1951, 60 y.o.   MRN: [id number]  HPI Doing well No new concerns Due for mammo  Still on the sertaline Comfortable with this  Current Outpatient Prescriptions on File Prior to Visit  Medication Sig Dispense Refill  . sertraline (ZOLOFT) 50 MG tablet TAKE 1 TABLET DAILY  90 tablet  2    Allergies  Allergen Reactions  . Atorvastatin     REACTION: abdominal cramping  . Penicillins     REACTION: rash  . Simvastatin     REACTION: abdominal cramping    Past Medical History  Diagnosis Date  . Depression   . GERD (gastroesophageal reflux disease)   . Hyperlipidemia   . Disorder of bone and cartilage, unspecified   . 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 Abdominal hysterectomy   . Vaginal delivery     Family History  Problem Relation Age of Onset  . Cancer Mother     vaginal cancer  . Hyperlipidemia Mother   . Hypertension Mother   . Diabetes Father   . Diabetes Sister   . Hypertension Sister   . Cancer Brother     colon cancer  . Heart disease Maternal Grandmother   . Stroke Maternal Grandmother     History   Social History  . Marital Status: Married    Spouse Name: N/A    Number of Children: 2  . Years of Education: N/A   Occupational History  . homemaker    Social History Main Topics  . Smoking status: Never Smoker   . Smokeless tobacco: Never Used  . Alcohol Use: Yes  . Drug Use: No  . Sexually Active: Not on file   Other Topics Concern  . Not on file   Social History Narrative  . No narrative on file   Review of Systems  Constitutional: Negative for fatigue and unexpected weight change.       Not much exercise--but walks and does treadmill sometimes Wears seat belt  HENT: Negative for hearing loss, congestion, rhinorrhea, dental problem and tinnitus.        Regular with dentist  Eyes: Negative for visual  disturbance.       No diplopia or unilateral vision loss  Respiratory: Negative for cough, chest tightness and shortness of breath.   Cardiovascular: Negative for chest pain, palpitations and leg swelling.  Gastrointestinal: Negative for nausea, vomiting, abdominal pain, constipation and blood in stool.       No heartburn  Genitourinary: Negative for dysuria, urgency, frequency, difficulty urinating and dyspareunia.  Musculoskeletal: Positive for back pain and arthralgias. Negative for joint swelling.       Chronic neck and back pain---plans to restart chiropractor  Skin: Negative for rash.       No suspicious areas Still with nummular eczema---sticks with cream  Neurological: Negative for dizziness, syncope, weakness, light-headedness, numbness and headaches.       Occ slight tingling in right hand upon awakening---occ during day. Goes away quickly  Hematological: Negative for adenopathy. Does not bruise/bleed easily.  Psychiatric/Behavioral: Negative for sleep disturbance and dysphoric mood. The patient is not nervous/anxious.        Objective:   Physical Exam  Constitutional: She is oriented to person, place, and time. She appears well-developed and well-nourished. No distress.  HENT:  Head: Normocephalic and atraumatic.  Right Ear:  External ear normal.  Left Ear: External ear normal.  Mouth/Throat: Oropharynx is clear and moist. No oropharyngeal exudate.       TMs normal  Eyes: Conjunctivae and EOM are normal. Pupils are equal, round, and reactive to light.       Fundi benign  Neck: Normal range of motion. Neck supple. No thyromegaly present.  Cardiovascular: Normal rate, regular rhythm, normal heart sounds and intact distal pulses.  Exam reveals no gallop.   No murmur heard. Pulmonary/Chest: Effort normal and breath sounds normal. No respiratory distress. She has no wheezes. She has no rales.  Abdominal: Soft. She exhibits no mass. There is no tenderness.  Genitourinary:        Bilateral cystic changes in breasts  Musculoskeletal: Normal range of motion. She exhibits no edema and no tenderness.  Lymphadenopathy:    She has no cervical adenopathy.  Neurological: She is alert and oriented to person, place, and time.  Skin: Skin is warm. No rash noted.  Psychiatric: She has a normal mood and affect. Her behavior is normal. Judgment and thought content normal.          Assessment & Plan:

## 2011-09-25 NOTE — Assessment & Plan Note (Signed)
Good control on sertraline No change (or she may try to wean a little)

## 2011-09-25 NOTE — Assessment & Plan Note (Signed)
On calcium and vitamin D

## 2011-09-25 NOTE — Patient Instructions (Signed)
Please set up your mammogram. 

## 2011-09-26 LAB — CBC WITH DIFFERENTIAL/PLATELET
Basophils Relative: 0.4 % (ref 0.0–3.0)
Eosinophils Relative: 0.9 % (ref 0.0–5.0)
Hemoglobin: 13.2 g/dL (ref 12.0–15.0)
Lymphocytes Relative: 29.3 % (ref 12.0–46.0)
Neutro Abs: 3.7 10*3/uL (ref 1.4–7.7)
Neutrophils Relative %: 61.2 % (ref 43.0–77.0)
Platelets: 275 10*3/uL (ref 150.0–400.0)
RBC: 4.01 Mil/uL (ref 3.87–5.11)
RDW: 13.3 % (ref 11.5–14.6)

## 2011-09-26 LAB — BASIC METABOLIC PANEL
CO2: 31 mEq/L (ref 19–32)
Glucose, Bld: 95 mg/dL (ref 70–99)
Potassium: 4.7 mEq/L (ref 3.5–5.1)
Sodium: 139 mEq/L (ref 135–145)

## 2011-09-26 LAB — HEPATIC FUNCTION PANEL
AST: 23 U/L (ref 0–37)
Albumin: 4.3 g/dL (ref 3.5–5.2)
Total Protein: 7.3 g/dL (ref 6.0–8.3)

## 2011-11-15 ENCOUNTER — Other Ambulatory Visit: Payer: Self-pay | Admitting: Internal Medicine

## 2011-12-11 ENCOUNTER — Ambulatory Visit: Payer: Self-pay | Admitting: Internal Medicine

## 2011-12-19 ENCOUNTER — Encounter: Payer: Self-pay | Admitting: *Deleted

## 2012-04-22 ENCOUNTER — Other Ambulatory Visit: Payer: Self-pay | Admitting: Internal Medicine

## 2012-07-21 ENCOUNTER — Other Ambulatory Visit: Payer: Self-pay | Admitting: Internal Medicine

## 2012-07-28 ENCOUNTER — Telehealth: Payer: Self-pay

## 2012-07-28 NOTE — Telephone Encounter (Signed)
Pt does not need refill now but wanted mail order pharmacy changed from [facility] to [facility] due to insurance requiring change. pts chart updated.

## 2012-08-31 ENCOUNTER — Ambulatory Visit (INDEPENDENT_AMBULATORY_CARE_PROVIDER_SITE_OTHER): Payer: 59

## 2012-08-31 DIAGNOSIS — Z23 Encounter for immunization: Secondary | ICD-10-CM

## 2012-09-24 ENCOUNTER — Encounter: Payer: Self-pay | Admitting: Internal Medicine

## 2012-09-24 ENCOUNTER — Ambulatory Visit (INDEPENDENT_AMBULATORY_CARE_PROVIDER_SITE_OTHER): Payer: 59 | Admitting: Internal Medicine

## 2012-09-24 VITALS — BP 136/86 | HR 76 | Temp 98.0°F | Ht 65.0 in | Wt 148.2 lb

## 2012-09-24 DIAGNOSIS — E785 Hyperlipidemia, unspecified: Secondary | ICD-10-CM

## 2012-09-24 DIAGNOSIS — Z0000 Encounter for general adult medical examination without abnormal findings: Secondary | ICD-10-CM

## 2012-09-24 DIAGNOSIS — M899 Disorder of bone, unspecified: Secondary | ICD-10-CM

## 2012-09-24 DIAGNOSIS — F329 Major depressive disorder, single episode, unspecified: Secondary | ICD-10-CM

## 2012-09-24 DIAGNOSIS — M949 Disorder of cartilage, unspecified: Secondary | ICD-10-CM

## 2012-09-24 DIAGNOSIS — F3289 Other specified depressive episodes: Secondary | ICD-10-CM

## 2012-09-24 LAB — BASIC METABOLIC PANEL
BUN: 18 mg/dL (ref 6–23)
CO2: 30 mEq/L (ref 19–32)
Calcium: 10 mg/dL (ref 8.4–10.5)
Chloride: 100 mEq/L (ref 96–112)
Creatinine, Ser: 0.7 mg/dL (ref 0.4–1.2)
Potassium: 4.3 mEq/L (ref 3.5–5.1)
Sodium: 136 mEq/L (ref 135–145)

## 2012-09-24 LAB — CBC WITH DIFFERENTIAL/PLATELET
Basophils Absolute: 0 10*3/uL (ref 0.0–0.1)
Basophils Relative: 0.5 % (ref 0.0–3.0)
Eosinophils Absolute: 0.1 10*3/uL (ref 0.0–0.7)
Eosinophils Relative: 1.1 % (ref 0.0–5.0)
HCT: 41 % (ref 36.0–46.0)
Lymphs Abs: 2.2 10*3/uL (ref 0.7–4.0)
MCV: 97.2 fl (ref 78.0–100.0)
Monocytes Relative: 10.8 % (ref 3.0–12.0)
Neutro Abs: 2.7 10*3/uL (ref 1.4–7.7)
Platelets: 280 10*3/uL (ref 150.0–400.0)
RDW: 13.3 % (ref 11.5–14.6)

## 2012-09-24 LAB — HEPATIC FUNCTION PANEL: ALT: 26 U/L (ref 0–35)

## 2012-09-24 LAB — LIPID PANEL
Cholesterol: 332 mg/dL — ABNORMAL HIGH (ref 0–200)
HDL: 45.8 mg/dL (ref >39.00)
Total CHOL/HDL Ratio: 7
VLDL: 29.8 mg/dL (ref 0.0–40.0)

## 2012-09-24 LAB — TSH: TSH: 2.28 u[IU]/mL (ref 0.35–5.50)

## 2012-09-24 NOTE — Progress Notes (Signed)
Subjective:    Patient ID: [name], female    DOB: 06-21-1951, 61 y.o.   MRN: [id number]  HPI Here for physical  Did see podiatrist for chronic pain--Dr Ralene Cork at [facility] Has meloxicam for prn use--not much help   Trying to exercise regularly Weight stable  Mood has been good Has been doing well without benedryl at night to sleep  Current Outpatient Prescriptions on File Prior to Visit  Medication Sig Dispense Refill  . Calcium Carbonate-Vitamin D (CALCIUM 600/VITAMIN D) 600-400 MG-UNIT per tablet Take 1 tablet by mouth 2 (two) times daily.      . sertraline (ZOLOFT) 50 MG tablet TAKE 1 TABLET DAILY  90 tablet  0    Allergies  Allergen Reactions  . Atorvastatin     REACTION: abdominal cramping  . Penicillins     REACTION: rash  . Simvastatin     REACTION: abdominal cramping    Past Medical History  Diagnosis Date  . Depression   . GERD (gastroesophageal reflux disease)   . Hyperlipidemia   . Disorder of bone and cartilage, unspecified   . 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 Abdominal hysterectomy   . Vaginal delivery     Family History  Problem Relation Age of Onset  . Cancer Mother     vaginal cancer  . Hyperlipidemia Mother   . Hypertension Mother   . Diabetes Father   . Diabetes Sister   . Hypertension Sister   . Cancer Brother     colon cancer  . Heart disease Maternal Grandmother   . Stroke Maternal Grandmother     History   Social History  . Marital Status: Married    Spouse Name: N/A    Number of Children: 2  . Years of Education: N/A   Occupational History  . homemaker    Social History Main Topics  . Smoking status: Never Smoker   . Smokeless tobacco: Never Used  . Alcohol Use: Yes     once a month  . Drug Use: No  . Sexually Active: Not on file   Other Topics Concern  . Not on file   Social History Narrative  . No narrative on file   Review of Systems    Constitutional: Negative for fatigue and unexpected weight change.       Wears seat belt  HENT: Positive for postnasal drip. Negative for hearing loss, congestion, rhinorrhea, dental problem and tinnitus.        Mild drip ---esp with dust. Rarely uses zyrtec Regular with dentist  Eyes: Negative for visual disturbance.       No diplopia or unilateral vision loss  Respiratory: Negative for cough, chest tightness and shortness of breath.   Cardiovascular: Negative for chest pain, palpitations and leg swelling.  Gastrointestinal: Negative for nausea, vomiting, abdominal pain, constipation and blood in stool.       Still no heartburn without meds  Genitourinary: Negative for dysuria, difficulty urinating and dyspareunia.       No incontinence  Musculoskeletal: Positive for arthralgias. Negative for back pain and joint swelling.       Just foot pain  Skin: Negative for rash.       Chronic dry skin  Neurological: Negative for dizziness, syncope, weakness, light-headedness, numbness and headaches.  Hematological: Negative for adenopathy. Bruises/bleeds easily.       No change in easy bruising  Psychiatric/Behavioral: Negative for disturbed wake/sleep cycle and dysphoric mood. The patient is not nervous/anxious.        Mood has worsened with attempts off sertraline       Objective:   Physical Exam  Constitutional: She is oriented to person, place, and time. She appears well-developed and well-nourished. No distress.  HENT:  Head: Normocephalic and atraumatic.  Right Ear: External ear normal.  Left Ear: External ear normal.  Mouth/Throat: Oropharynx is clear and moist. No oropharyngeal exudate.  Eyes: Conjunctivae normal and EOM are normal. Pupils are equal, round, and reactive to light.  Neck: Normal range of motion. Neck supple. No thyromegaly present.  Cardiovascular: Normal rate, regular rhythm, normal heart sounds and intact distal pulses.  Exam reveals no gallop.   No murmur  heard. Pulmonary/Chest: Effort normal and breath sounds normal. No respiratory distress. She has no wheezes. She has no rales.  Abdominal: Soft. There is no tenderness.  Genitourinary:       Bilateral cystic changes but no worrisome masses  Musculoskeletal: She exhibits no edema and no tenderness.  Lymphadenopathy:    She has no cervical adenopathy.  Neurological: She is alert and oriented to person, place, and time.  Skin: No rash noted. No erythema.  Psychiatric: She has a normal mood and affect. Her behavior is normal. Thought content normal.          Assessment & Plan:

## 2012-09-24 NOTE — Assessment & Plan Note (Signed)
Controlled on sertraline Continue indefinitely

## 2012-09-24 NOTE — Assessment & Plan Note (Signed)
on calcium and vitamin D Regular weight bearing exercise

## 2012-09-24 NOTE — Patient Instructions (Signed)
Please call for an appointment with Dr Patsy Lager here if you want more evaluation or orthotics for your feet

## 2012-09-24 NOTE — Assessment & Plan Note (Signed)
Healthy mammo every 2 years UTD on everything Counseled about fitness

## 2012-09-24 NOTE — Assessment & Plan Note (Signed)
Discussed primary prevention Doesn't want meds now Discussed lifestyle

## 2012-09-28 ENCOUNTER — Encounter: Payer: Self-pay | Admitting: *Deleted

## 2013-01-24 ENCOUNTER — Other Ambulatory Visit: Payer: Self-pay | Admitting: *Deleted

## 2013-01-24 MED ORDER — SERTRALINE HCL 50 MG PO TABS: ORAL_TABLET | ORAL | Status: DC

## 2013-03-16 ENCOUNTER — Telehealth: Payer: Self-pay | Admitting: Internal Medicine

## 2013-03-16 ENCOUNTER — Other Ambulatory Visit: Payer: Self-pay | Admitting: Internal Medicine

## 2013-03-16 MED ORDER — SERTRALINE HCL 50 MG PO TABS: ORAL_TABLET | ORAL | Status: DC

## 2013-03-16 MED ORDER — TRIAMCINOLONE ACETONIDE 0.025 % EX CREA: 1.0000 "application " | TOPICAL_CREAM | Freq: Two times a day (BID) | CUTANEOUS | Status: DC

## 2013-03-16 NOTE — Telephone Encounter (Signed)
Okay to refill #1 tube with no refills

## 2013-03-16 NOTE — Telephone Encounter (Signed)
rx sent to pharmacy by e-script  

## 2013-03-16 NOTE — Telephone Encounter (Signed)
Pt left v/m about two years ago pt was seeing Dr Alphonsus Sias and dermatologist about dry skin spots on face; pt request refill Triamcinolone Acetonide cream 0.025% to apply as needed. Pt request Dr Alphonsus Sias to refill to [facility].Please advise.

## 2013-05-11 ENCOUNTER — Other Ambulatory Visit: Payer: Self-pay | Admitting: Internal Medicine

## 2013-08-25 ENCOUNTER — Ambulatory Visit (INDEPENDENT_AMBULATORY_CARE_PROVIDER_SITE_OTHER): Payer: 59

## 2013-08-25 DIAGNOSIS — Z23 Encounter for immunization: Secondary | ICD-10-CM

## 2013-09-26 ENCOUNTER — Encounter: Payer: 59 | Admitting: Internal Medicine

## 2013-10-13 ENCOUNTER — Other Ambulatory Visit: Payer: Self-pay | Admitting: Internal Medicine

## 2013-12-21 ENCOUNTER — Encounter: Payer: Self-pay | Admitting: Internal Medicine

## 2013-12-21 ENCOUNTER — Ambulatory Visit (INDEPENDENT_AMBULATORY_CARE_PROVIDER_SITE_OTHER): Payer: 59 | Admitting: Internal Medicine

## 2013-12-21 VITALS — BP 118/80 | HR 104 | Temp 97.7°F | Wt 150.0 lb

## 2013-12-21 DIAGNOSIS — F329 Major depressive disorder, single episode, unspecified: Secondary | ICD-10-CM

## 2013-12-21 DIAGNOSIS — Z0000 Encounter for general adult medical examination without abnormal findings: Secondary | ICD-10-CM

## 2013-12-21 DIAGNOSIS — L259 Unspecified contact dermatitis, unspecified cause: Secondary | ICD-10-CM

## 2013-12-21 DIAGNOSIS — L300 Nummular dermatitis: Secondary | ICD-10-CM

## 2013-12-21 DIAGNOSIS — E785 Hyperlipidemia, unspecified: Secondary | ICD-10-CM

## 2013-12-21 DIAGNOSIS — F3289 Other specified depressive episodes: Secondary | ICD-10-CM

## 2013-12-21 NOTE — Assessment & Plan Note (Signed)
Discussed again No statin for primary prevention

## 2013-12-21 NOTE — Patient Instructions (Signed)
Please set up your screening mammogram.  DASH Diet The DASH diet stands for "Dietary Approaches to Stop Hypertension." It is a healthy eating plan that has been shown to reduce high blood pressure (hypertension) in as little as 14 days, while also possibly providing other significant health benefits. These other health benefits include reducing the risk of breast cancer after menopause and reducing the risk of type 2 diabetes, heart disease, colon cancer, and stroke. Health benefits also include weight loss and slowing kidney failure in patients with chronic kidney disease.  DIET GUIDELINES  Limit salt (sodium). Your diet should contain less than 1500 mg of sodium daily.  Limit refined or processed carbohydrates. Your diet should include mostly whole grains. Desserts and added sugars should be used sparingly.  Include small amounts of heart-healthy fats. These types of fats include nuts, oils, and tub margarine. Limit saturated and trans fats. These fats have been shown to be harmful in the body. CHOOSING FOODS  The following food groups are based on a 2000 calorie diet. See your Registered Dietitian for individual calorie needs. Grains and Grain Products (6 to 8 servings daily)  Eat More Often: Whole-wheat bread, brown rice, whole-grain or wheat pasta, quinoa, popcorn without added fat or salt (air popped).  Eat Less Often: White bread, white pasta, white rice, cornbread. Vegetables (4 to 5 servings daily)  Eat More Often: Fresh, frozen, and canned vegetables. Vegetables may be raw, steamed, roasted, or grilled with a minimal amount of fat.  Eat Less Often/Avoid: Creamed or fried vegetables. Vegetables in a cheese sauce. Fruit (4 to 5 servings daily)  Eat More Often: All fresh, canned (in natural juice), or frozen fruits. Dried fruits without added sugar. One hundred percent fruit juice ( cup [237 mL] daily).  Eat Less Often: Dried fruits with added sugar. Canned fruit in light or heavy  syrup. [employer], Fish, and Poultry (2 servings or less daily. One serving is 3 to 4 oz [85-114 g]).  Eat More Often: Ninety percent or leaner ground beef, tenderloin, sirloin. Round cuts of beef, chicken breast, [place] breast. All fish. Grill, bake, or broil your meat. Nothing should be fried.  Eat Less Often/Avoid: Fatty cuts of meat, [place], or chicken leg, thigh, or wing. Fried cuts of meat or fish. Dairy (2 to 3 servings)  Eat More Often: Low-fat or fat-free milk, low-fat plain or light yogurt, reduced-fat or part-skim cheese.  Eat Less Often/Avoid: Milk (whole, 2%).Whole milk yogurt. Full-fat cheeses. Nuts, Seeds, and Legumes (4 to 5 servings per week)  Eat More Often: All without added salt.  Eat Less Often/Avoid: Salted nuts and seeds, canned beans with added salt. Fats and Sweets (limited)  Eat More Often: Vegetable oils, tub margarines without trans fats, sugar-free gelatin. Mayonnaise and salad dressings.  Eat Less Often/Avoid: Coconut oils, palm oils, butter, stick margarine, cream, half and half, cookies, candy, pie. FOR MORE INFORMATION The Dash Diet Eating Plan: www.dashdiet.org Document Released: 10/30/2011 Document Revised: 02/02/2012 Document Reviewed: 10/30/2011 [facility] Patient Information 2014 [place], [place].

## 2013-12-21 NOTE — Assessment & Plan Note (Addendum)
Healthy but needs to work on fitness Due for mammogram Will defer labs this year

## 2013-12-21 NOTE — Assessment & Plan Note (Signed)
In remission on sertraline Will continue indefinitely 

## 2013-12-21 NOTE — Assessment & Plan Note (Signed)
Has regimen from the dermatologist

## 2013-12-21 NOTE — Progress Notes (Signed)
Subjective:    Patient ID: [name], female    DOB: March 15, 1951, 63 y.o.   MRN: [id number]  HPI Here for physical Doing well  Concerned about weight---up 1.7# Frustrated by foot pain--limits her walking X-ray of foot shows arthritis Discussed alternate exercises  Mood is still good Happy continuing the sertraline  Current Outpatient Prescriptions on File Prior to Visit  Medication Sig Dispense Refill  . Calcium Carbonate-Vitamin D (CALCIUM 600/VITAMIN D) 600-400 MG-UNIT per tablet Take 1 tablet by mouth 2 (two) times daily.      . sertraline (ZOLOFT) 50 MG tablet Take 1 tablet by mouth   daily  90 tablet  0   No current facility-administered medications on file prior to visit.    Allergies  Allergen Reactions  . Atorvastatin     REACTION: abdominal cramping  . Penicillins     REACTION: rash  . Simvastatin     REACTION: abdominal cramping    Past Medical History  Diagnosis Date  . Depression   . GERD (gastroesophageal reflux disease)   . Hyperlipidemia   . Disorder of bone and cartilage, unspecified   . 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 Abdominal hysterectomy    . Vaginal delivery      Family History  Problem Relation Age of Onset  . Cancer Mother     vaginal cancer  . Hyperlipidemia Mother   . Hypertension Mother   . Diabetes Father   . Diabetes Sister   . Hypertension Sister   . Cancer Brother     colon cancer  . Heart disease Maternal Grandmother   . Stroke Maternal Grandmother     History   Social History  . Marital Status: Married    Spouse Name: N/A    Number of Children: 2  . Years of Education: N/A   Occupational History  . homemaker    Social History Main Topics  . Smoking status: Never Smoker   . Smokeless tobacco: Never Used  . Alcohol Use: Yes     Comment: once a month  . Drug Use: No  . Sexual Activity: Not on file   Other Topics Concern    . Not on file   Social History Narrative  . No narrative on file   Review of Systems  Constitutional: Negative for fatigue and unexpected weight change.       Wears seat belt  HENT: Positive for congestion. Negative for dental problem, hearing loss and tinnitus.        Regular with dentist ?slight cold now  Eyes: Negative for visual disturbance.       No diplopia or unilateral vision  Respiratory: Negative for cough, chest tightness and shortness of breath.   Cardiovascular: Positive for palpitations. Negative for chest pain and leg swelling.       Occ mild palpitations  Gastrointestinal: Negative for nausea, vomiting, abdominal pain, constipation and blood in stool.       No heartburn  Endocrine: Negative for cold intolerance and heat intolerance.  Genitourinary: Negative for dysuria, hematuria, difficulty urinating and dyspareunia.  Musculoskeletal: Positive for arthralgias. Negative for back pain and myalgias.       Left CMC pain at times Foot pain per HPI  Skin: Negative for rash.       Keeps up with the dermatologist  Allergic/Immunologic: Negative for environmental allergies and immunocompromised state.  Neurological: Positive  for headaches. Negative for dizziness, syncope, weakness, light-headedness and numbness.       Will get visual aura if sun exposure-- only rare headache  Hematological: Negative for adenopathy. Bruises/bleeds easily.  Psychiatric/Behavioral: Negative for sleep disturbance and dysphoric mood. The patient is nervous/anxious.        Some stress with death of mom and father in law       Objective:   Physical Exam  Constitutional: She appears well-developed and well-nourished. No distress.  HENT:  Head: Normocephalic and atraumatic.  Right Ear: External ear normal.  Left Ear: External ear normal.  Mouth/Throat: Oropharynx is clear and moist. No oropharyngeal exudate.  Eyes: Conjunctivae and EOM are normal. Pupils are equal, round, and reactive to  light.  Neck: Normal range of motion. Neck supple. No thyromegaly present.  Cardiovascular: Normal rate, regular rhythm, normal heart sounds and intact distal pulses.  Exam reveals no gallop.   No murmur heard. Pulmonary/Chest: Effort normal and breath sounds normal. No respiratory distress. She has no wheezes. She has no rales.  Abdominal: Soft. There is no tenderness.  Genitourinary:  Moderate cystic areas in both breasts but no worrisome masses  Musculoskeletal: She exhibits no edema and no tenderness.  Lymphadenopathy:    She has no cervical adenopathy.    She has no axillary adenopathy.  Skin: No erythema.  Scattered small eczematous patches  Psychiatric: She has a normal mood and affect. Her behavior is normal.          Assessment & Plan:

## 2013-12-21 NOTE — Progress Notes (Signed)
Pre-visit discussion using our clinic review tool. No additional management support is needed unless otherwise documented below in the visit note.  

## 2014-01-02 ENCOUNTER — Ambulatory Visit: Payer: Self-pay | Admitting: Internal Medicine

## 2014-01-03 ENCOUNTER — Encounter: Payer: Self-pay | Admitting: Internal Medicine

## 2014-01-14 ENCOUNTER — Other Ambulatory Visit: Payer: Self-pay | Admitting: Internal Medicine

## 2014-04-12 ENCOUNTER — Other Ambulatory Visit: Payer: Self-pay

## 2014-04-12 MED ORDER — SERTRALINE HCL 50 MG PO TABS: ORAL_TABLET | ORAL | Status: DC

## 2014-04-12 NOTE — Telephone Encounter (Signed)
Pt left note requesting zoloft refill to [facility]. Advised pt done.

## 2014-08-07 ENCOUNTER — Other Ambulatory Visit: Payer: Self-pay | Admitting: Internal Medicine

## 2014-09-27 ENCOUNTER — Ambulatory Visit (INDEPENDENT_AMBULATORY_CARE_PROVIDER_SITE_OTHER): Payer: 59

## 2014-09-27 DIAGNOSIS — Z23 Encounter for immunization: Secondary | ICD-10-CM

## 2014-11-29 ENCOUNTER — Other Ambulatory Visit: Payer: Self-pay | Admitting: *Deleted

## 2014-11-29 MED ORDER — SERTRALINE HCL 50 MG PO TABS: ORAL_TABLET | ORAL | Status: DC

## 2014-12-25 ENCOUNTER — Encounter: Payer: Self-pay | Admitting: Internal Medicine

## 2014-12-25 ENCOUNTER — Ambulatory Visit (INDEPENDENT_AMBULATORY_CARE_PROVIDER_SITE_OTHER): Payer: Medicare Other | Admitting: Internal Medicine

## 2014-12-25 VITALS — BP 122/76 | HR 75 | Temp 98.4°F | Resp 14 | Ht 64.5 in | Wt 154.0 lb

## 2014-12-25 DIAGNOSIS — Z0000 Encounter for general adult medical examination without abnormal findings: Secondary | ICD-10-CM

## 2014-12-25 DIAGNOSIS — E785 Hyperlipidemia, unspecified: Secondary | ICD-10-CM

## 2014-12-25 DIAGNOSIS — K219 Gastro-esophageal reflux disease without esophagitis: Secondary | ICD-10-CM

## 2014-12-25 DIAGNOSIS — M8580 Other specified disorders of bone density and structure, unspecified site: Secondary | ICD-10-CM

## 2014-12-25 DIAGNOSIS — F39 Unspecified mood [affective] disorder: Secondary | ICD-10-CM

## 2014-12-25 DIAGNOSIS — Z7189 Other specified counseling: Secondary | ICD-10-CM

## 2014-12-25 DIAGNOSIS — Z23 Encounter for immunization: Secondary | ICD-10-CM

## 2014-12-25 DIAGNOSIS — E2839 Other primary ovarian failure: Secondary | ICD-10-CM

## 2014-12-25 LAB — COMPREHENSIVE METABOLIC PANEL
ALBUMIN: 4.5 g/dL (ref 3.5–5.2)
ALK PHOS: 64 U/L (ref 39–117)
ALT: 17 U/L (ref 0–35)
AST: 17 U/L (ref 0–37)
BUN: 19 mg/dL (ref 6–23)
CO2: 31 mEq/L (ref 19–32)
Calcium: 10.7 mg/dL — ABNORMAL HIGH (ref 8.4–10.5)
Chloride: 101 mEq/L (ref 96–112)
Creatinine, Ser: 0.7 mg/dL (ref 0.40–1.20)
GFR: 89.27 mL/min (ref >60.00)
Glucose, Bld: 93 mg/dL (ref 70–99)
POTASSIUM: 4.3 meq/L (ref 3.5–5.1)
SODIUM: 138 meq/L (ref 135–145)
Total Bilirubin: 0.4 mg/dL (ref 0.2–1.2)
Total Protein: 7.5 g/dL (ref 6.0–8.3)

## 2014-12-25 LAB — CBC WITH DIFFERENTIAL/PLATELET
BASOS ABS: 0 10*3/uL (ref 0.0–0.1)
BASOS PCT: 0.6 % (ref 0.0–3.0)
Eosinophils Absolute: 0.1 10*3/uL (ref 0.0–0.7)
Eosinophils Relative: 1.7 % (ref 0.0–5.0)
HEMATOCRIT: 40.9 % (ref 36.0–46.0)
Hemoglobin: 13.9 g/dL (ref 12.0–15.0)
Lymphocytes Relative: 38.7 % (ref 12.0–46.0)
Lymphs Abs: 2.3 10*3/uL (ref 0.7–4.0)
MCHC: 33.9 g/dL (ref 30.0–36.0)
MCV: 93.6 fl (ref 78.0–100.0)
Monocytes Absolute: 0.6 10*3/uL (ref 0.1–1.0)
Monocytes Relative: 9.8 % (ref 3.0–12.0)
Neutro Abs: 3 10*3/uL (ref 1.4–7.7)
Neutrophils Relative %: 49.2 % (ref 43.0–77.0)
Platelets: 301 10*3/uL (ref 150.0–400.0)
RBC: 4.37 Mil/uL (ref 3.87–5.11)
RDW: 13.5 % (ref 11.5–15.5)
WBC: 6.1 10*3/uL (ref 4.0–10.5)

## 2014-12-25 LAB — LIPID PANEL
CHOLESTEROL: 303 mg/dL — AB (ref 0–200)
HDL: 48.7 mg/dL (ref >39.00)
LDL Cholesterol: 215 mg/dL — ABNORMAL HIGH (ref 0–99)
NonHDL: 254.3
Total CHOL/HDL Ratio: 6
Triglycerides: 197 mg/dL — ABNORMAL HIGH (ref 0.0–149.0)
VLDL: 39.4 mg/dL (ref 0.0–40.0)

## 2014-12-25 LAB — T4, FREE: Free T4: 0.77 ng/dL (ref 0.60–1.60)

## 2014-12-25 MED ORDER — SERTRALINE HCL 50 MG PO TABS: ORAL_TABLET | ORAL | Status: DC

## 2014-12-25 NOTE — Patient Instructions (Signed)
You are due for your mammogram in 2/17 Your colonoscopy is not due till 2021.

## 2014-12-25 NOTE — Progress Notes (Signed)
Pre visit review using our clinic review tool, if applicable. No additional management support is needed unless otherwise documented below in the visit note. 

## 2014-12-25 NOTE — Addendum Note (Signed)
Addended by: Geni Bers on: 12/25/2014 11:55 AM   Modules accepted: Orders

## 2014-12-25 NOTE — Assessment & Plan Note (Signed)
Rare symptoms--like night cough Not much of a problem lately Prevacid in past

## 2014-12-25 NOTE — Assessment & Plan Note (Signed)
Will recheck bone density If still no osteoporosis, no need to recheck

## 2014-12-25 NOTE — Assessment & Plan Note (Signed)
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

## 2014-12-25 NOTE — Progress Notes (Signed)
Subjective:    Patient ID: [name], female    DOB: [birth year], 64 y.o.   MRN: [id number]  HPI Here for Welcome to Medicare visit and follow up of chronic medical issues Reviewed form and advanced directives Reviewed her other physicians No hospitalizations or procedures this year Did have 1 fall-- mild left hand injury Does do regular exercise Does have rare alcohol No tobacco Independent with all instrumental ADLs Vision and hearing are fine No apparent cognitive problems  Mood has been fine Continues on the zoloft No significant anxiety Sleeps pretty well in general  Did have DEXA back 13-14 years ago Mild bone loss Put on meds but "hated" them Regular weight bearing exercise--- calcium and vitamin D  Still prefers no primary prevention for cholesterol  Current Outpatient Prescriptions on File Prior to Visit  Medication Sig Dispense Refill  . Calcium Carbonate-Vitamin D (CALCIUM 600/VITAMIN D) 600-400 MG-UNIT per tablet Take 1 tablet by mouth 2 (two) times daily.    . diphenhydrAMINE (BENADRYL) 25 MG tablet Take 25 mg by mouth daily.    . Omega-3 Fatty Acids (FISH OIL) 1000 MG CAPS Take 1 capsule by mouth daily.    . sertraline (ZOLOFT) 50 MG tablet Take 1 tablet by mouth  daily 90 tablet 0   No current facility-administered medications on file prior to visit.    Allergies  Allergen Reactions  . Atorvastatin     REACTION: abdominal cramping  . Penicillins     REACTION: rash  . Simvastatin     REACTION: abdominal cramping    Past Medical History  Diagnosis Date  . Depression   . GERD (gastroesophageal reflux disease)   . Hyperlipidemia   . Disorder of bone and cartilage, unspecified   . 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 Abdominal hysterectomy    . Vaginal delivery      Family History  Problem Relation Age of Onset  . Cancer Mother     vaginal cancer  .  Hyperlipidemia Mother   . Hypertension Mother   . Diabetes Father   . Diabetes Sister   . Hypertension Sister   . Cancer Brother     colon cancer  . Heart disease Maternal Grandmother   . Stroke Maternal Grandmother     History   Social History  . Marital Status: Married    Spouse Name: N/A    Number of Children: 2  . Years of Education: N/A   Occupational History  . homemaker    Social History Main Topics  . Smoking status: Never Smoker   . Smokeless tobacco: Never Used  . Alcohol Use: Yes     Comment: once a month  . Drug Use: No  . Sexual Activity: Not on file   Other Topics Concern  . Not on file   Social History Narrative   No formal living will   Plans to do health care POA---would be husband   Would accept resuscitation   No tube feeds if cognitively unaware   Review of Systems Weight is stable Bowels are better No skin problems No teeth problems---regular with dentist Wears seat belt No chest pain, SOB, dizziness or syncope    Objective:   Physical Exam  Constitutional: She is oriented to person, place, and time. She appears well-developed and well-nourished. No distress.  HENT:  Mouth/Throat: Oropharynx is clear and moist. No oropharyngeal exudate.  Neck: Normal range of motion. Neck supple. No thyromegaly present.  Cardiovascular: Normal rate, regular rhythm, normal heart sounds and intact distal pulses.  Exam reveals no gallop.   No murmur heard. Pulmonary/Chest: Effort normal and breath sounds normal. No respiratory distress. She has no wheezes. She has no rales.  Abdominal: Soft. There is no tenderness.  Musculoskeletal: She exhibits no edema or tenderness.  Lymphadenopathy:    She has no cervical adenopathy.  Neurological: She is alert and oriented to person, place, and time.  President-- "Obama, ?" [id number] Recall 3/3  Skin: No rash noted. No erythema.  Psychiatric: She has a normal mood and affect. Her behavior is  normal.          Assessment & Plan:

## 2014-12-25 NOTE — Assessment & Plan Note (Signed)
Still not interested in primary prevention with statin

## 2014-12-25 NOTE — Assessment & Plan Note (Signed)
See social history 

## 2014-12-25 NOTE — Assessment & Plan Note (Signed)
I have personally reviewed the Medicare Annual Wellness questionnaire and have noted 1. The patient's medical and social history 2. Their use of alcohol, tobacco or illicit drugs 3. Their current medications and supplements 4. The patient's functional ability including ADL's, fall risks, home safety risks and hearing or visual             impairment. 5. Diet and physical activities 6. Evidence for depression or mood disorders  The patients weight, height, BMI and visual acuity have been recorded in the chart I have made referrals, counseling and provided education to the patient based review of the above and I have provided the pt with a written personalized care plan for preventive services.  I have provided you with a copy of your personalized plan for preventive services. Please take the time to review along with your updated medication list.  prevnar today Pneumovax next year mammo due next year---she preferred no breast exam Colon due 2021

## 2014-12-26 ENCOUNTER — Ambulatory Visit (INDEPENDENT_AMBULATORY_CARE_PROVIDER_SITE_OTHER)
Admission: RE | Admit: 2014-12-26 | Discharge: 2014-12-26 | Disposition: A | Discharge status: Home / Self Care | Payer: Medicare Other | Source: Ambulatory Visit | Attending: Internal Medicine | Admitting: Internal Medicine

## 2014-12-26 DIAGNOSIS — E2839 Other primary ovarian failure: Secondary | ICD-10-CM

## 2015-01-15 ENCOUNTER — Other Ambulatory Visit: Payer: Self-pay | Admitting: Orthopaedic Surgery

## 2015-01-15 DIAGNOSIS — M25522 Pain in left elbow: Secondary | ICD-10-CM

## 2015-01-16 ENCOUNTER — Other Ambulatory Visit: Payer: Self-pay | Admitting: Orthopaedic Surgery

## 2015-01-16 DIAGNOSIS — M25522 Pain in left elbow: Secondary | ICD-10-CM

## 2015-01-17 ENCOUNTER — Ambulatory Visit
Admission: RE | Admit: 2015-01-17 | Discharge: 2015-01-17 | Disposition: A | Discharge status: Home / Self Care | Payer: Medicare Other | Source: Ambulatory Visit | Attending: Orthopaedic Surgery | Admitting: Orthopaedic Surgery

## 2015-01-17 ENCOUNTER — Encounter (HOSPITAL_COMMUNITY): Payer: Self-pay | Admitting: *Deleted

## 2015-01-17 DIAGNOSIS — M25522 Pain in left elbow: Secondary | ICD-10-CM

## 2015-01-17 MED ORDER — SODIUM CHLORIDE 0.9 % IV SOLN: INTRAVENOUS | Status: DC

## 2015-01-17 MED ORDER — VANCOMYCIN HCL IN DEXTROSE 1-5 GM/200ML-% IV SOLN
1000.0000 mg | INTRAVENOUS | Status: AC
  Administered 2015-01-18: 1000 mg via INTRAVENOUS

## 2015-01-17 MED ORDER — ACETAMINOPHEN 10 MG/ML IV SOLN
1000.0000 mg | Freq: Once | INTRAVENOUS | Status: AC
  Administered 2015-01-18: 1000 mg via INTRAVENOUS
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
## 2015-01-18 ENCOUNTER — Encounter (HOSPITAL_COMMUNITY)
Admission: RE | Disposition: A | Discharge status: Home / Self Care | Payer: Self-pay | Source: Ambulatory Visit | Attending: Orthopaedic Surgery

## 2015-01-18 ENCOUNTER — Ambulatory Visit (HOSPITAL_COMMUNITY)
Admission: RE | Admit: 2015-01-18 | Discharge: 2015-01-18 | Disposition: A | Discharge status: Home / Self Care | Payer: Medicare Other | Source: Ambulatory Visit | Attending: Orthopaedic Surgery | Admitting: Orthopaedic Surgery

## 2015-01-18 ENCOUNTER — Encounter (HOSPITAL_COMMUNITY): Payer: Self-pay | Admitting: *Deleted

## 2015-01-18 ENCOUNTER — Ambulatory Visit (HOSPITAL_COMMUNITY): Payer: Medicare Other | Admitting: Certified Registered Nurse Anesthetist

## 2015-01-18 DIAGNOSIS — L309 Dermatitis, unspecified: Secondary | ICD-10-CM | POA: Insufficient documentation

## 2015-01-18 DIAGNOSIS — Z880 Allergy status to penicillin: Secondary | ICD-10-CM | POA: Diagnosis not present

## 2015-01-18 DIAGNOSIS — F329 Major depressive disorder, single episode, unspecified: Secondary | ICD-10-CM | POA: Diagnosis not present

## 2015-01-18 DIAGNOSIS — M8580 Other specified disorders of bone density and structure, unspecified site: Secondary | ICD-10-CM | POA: Insufficient documentation

## 2015-01-18 DIAGNOSIS — W010XXA Fall on same level from slipping, tripping and stumbling without subsequent striking against object, initial encounter: Secondary | ICD-10-CM | POA: Diagnosis not present

## 2015-01-18 DIAGNOSIS — S52042A Displaced fracture of coronoid process of left ulna, initial encounter for closed fracture: Secondary | ICD-10-CM | POA: Diagnosis present

## 2015-01-18 DIAGNOSIS — S52022A Displaced fracture of olecranon process without intraarticular extension of left ulna, initial encounter for closed fracture: Secondary | ICD-10-CM | POA: Diagnosis present

## 2015-01-18 DIAGNOSIS — Z90710 Acquired absence of both cervix and uterus: Secondary | ICD-10-CM | POA: Diagnosis not present

## 2015-01-18 DIAGNOSIS — E785 Hyperlipidemia, unspecified: Secondary | ICD-10-CM | POA: Diagnosis not present

## 2015-01-18 DIAGNOSIS — K219 Gastro-esophageal reflux disease without esophagitis: Secondary | ICD-10-CM | POA: Insufficient documentation

## 2015-01-18 DIAGNOSIS — S52122A Displaced fracture of head of left radius, initial encounter for closed fracture: Secondary | ICD-10-CM | POA: Diagnosis present

## 2015-01-18 DIAGNOSIS — Z888 Allergy status to other drugs, medicaments and biological substances status: Secondary | ICD-10-CM | POA: Insufficient documentation

## 2015-01-18 DIAGNOSIS — S52092A Other fracture of upper end of left ulna, initial encounter for closed fracture: Secondary | ICD-10-CM | POA: Diagnosis present

## 2015-01-18 DIAGNOSIS — F1099 Alcohol use, unspecified with unspecified alcohol-induced disorder: Secondary | ICD-10-CM | POA: Insufficient documentation

## 2015-01-18 HISTORY — DX: Bell's palsy: G51.0

## 2015-01-18 HISTORY — DX: Other specified postprocedural states: Z98.890

## 2015-01-18 HISTORY — PX: ORIF ULNAR FRACTURE: SHX5417

## 2015-01-18 HISTORY — DX: Unspecified fracture of sternum, initial encounter for closed fracture: S22.20XA

## 2015-01-18 HISTORY — DX: Anemia, unspecified: D64.9

## 2015-01-18 HISTORY — DX: Other specified postprocedural states: R11.2

## 2015-01-18 HISTORY — DX: Dermatitis, unspecified: L30.9

## 2015-01-18 LAB — CBC WITH DIFFERENTIAL/PLATELET
BASOS ABS: 0 10*3/uL (ref 0.0–0.1)
BASOS PCT: 0 % (ref 0–1)
EOS PCT: 2 % (ref 0–5)
Eosinophils Absolute: 0.1 10*3/uL (ref 0.0–0.7)
HCT: 36.5 % (ref 36.0–46.0)
Hemoglobin: 12.1 g/dL (ref 12.0–15.0)
LYMPHS ABS: 2.1 10*3/uL (ref 0.7–4.0)
Lymphocytes Relative: 35 % (ref 12–46)
MCH: 32 pg (ref 26.0–34.0)
MCHC: 33.2 g/dL (ref 30.0–36.0)
MCV: 96.6 fL (ref 78.0–100.0)
Monocytes Absolute: 0.5 10*3/uL (ref 0.1–1.0)
Monocytes Relative: 8 % (ref 3–12)
NEUTROS PCT: 55 % (ref 43–77)
Neutro Abs: 3.2 10*3/uL (ref 1.7–7.7)
PLATELETS: 262 10*3/uL (ref 150–400)
RBC: 3.78 MIL/uL — AB (ref 3.87–5.11)
RDW: 13 % (ref 11.5–15.5)
WBC: 5.8 10*3/uL (ref 4.0–10.5)

## 2015-01-18 LAB — COMPREHENSIVE METABOLIC PANEL
ALBUMIN: 3.7 g/dL (ref 3.5–5.2)
ALT: 22 U/L (ref 0–35)
AST: 21 U/L (ref 0–37)
Alkaline Phosphatase: 56 U/L (ref 39–117)
Anion gap: 8 (ref 5–15)
BILIRUBIN TOTAL: 0.7 mg/dL (ref 0.3–1.2)
BUN: 15 mg/dL (ref 6–23)
CALCIUM: 9.5 mg/dL (ref 8.4–10.5)
CHLORIDE: 103 mmol/L (ref 96–112)
CO2: 25 mmol/L (ref 19–32)
Creatinine, Ser: 0.66 mg/dL (ref 0.50–1.10)
GFR calc Af Amer: >90 mL/min (ref >90)
Glucose, Bld: 101 mg/dL — ABNORMAL HIGH (ref 70–99)
Potassium: 3.8 mmol/L (ref 3.5–5.1)
Sodium: 136 mmol/L (ref 135–145)
Total Protein: 7.1 g/dL (ref 6.0–8.3)

## 2015-01-18 LAB — APTT: APTT: 30 s (ref 24–37)

## 2015-01-18 LAB — PROTIME-INR
INR: 0.99 (ref 0.00–1.49)
Prothrombin Time: 13.1 seconds (ref 11.6–15.2)

## 2015-01-18 SURGERY — OPEN REDUCTION INTERNAL FIXATION (ORIF) ULNAR FRACTURE
Anesthesia: General | Site: Elbow | Laterality: Left

## 2015-01-18 MED ORDER — SODIUM CHLORIDE 0.9 % IV SOLN
10.0000 mg | INTRAVENOUS | Status: DC | PRN
  Administered 2015-01-18: 25 ug/min via INTRAVENOUS

## 2015-01-18 MED ORDER — PROPOFOL 10 MG/ML IV BOLUS
INTRAVENOUS | Status: DC | PRN
  Administered 2015-01-18: 160 mg via INTRAVENOUS

## 2015-01-18 MED ORDER — 0.9 % SODIUM CHLORIDE (POUR BTL) OPTIME
TOPICAL | Status: DC | PRN
  Administered 2015-01-18: 1000 mL

## 2015-01-18 MED ORDER — PHENYLEPHRINE HCL 10 MG/ML IJ SOLN
INTRAMUSCULAR | Status: DC | PRN
  Administered 2015-01-18 (×2): 80 ug via INTRAVENOUS

## 2015-01-18 MED ORDER — ACETAMINOPHEN 10 MG/ML IV SOLN
INTRAVENOUS | Status: AC
  Filled 2015-01-18: qty 100

## 2015-01-18 MED ORDER — OXYCODONE HCL 5 MG PO TABS
ORAL_TABLET | ORAL | Status: AC
  Filled 2015-01-18: qty 1

## 2015-01-18 MED ORDER — LACTATED RINGERS IV SOLN
INTRAVENOUS | Status: DC
  Administered 2015-01-18: 10:00:00 via INTRAVENOUS

## 2015-01-18 MED ORDER — ROCURONIUM BROMIDE 50 MG/5ML IV SOLN
INTRAVENOUS | Status: AC
  Filled 2015-01-18: qty 1

## 2015-01-18 MED ORDER — VANCOMYCIN HCL IN DEXTROSE 1-5 GM/200ML-% IV SOLN
INTRAVENOUS | Status: AC
  Filled 2015-01-18: qty 200

## 2015-01-18 MED ORDER — OXYCODONE HCL 5 MG/5ML PO SOLN: 5.0000 mg | Freq: Once | ORAL | Status: AC | PRN

## 2015-01-18 MED ORDER — ACETAMINOPHEN 325 MG PO TABS: 325.0000 mg | ORAL_TABLET | ORAL | Status: DC | PRN

## 2015-01-18 MED ORDER — STERILE WATER FOR INJECTION IJ SOLN
INTRAMUSCULAR | Status: AC
  Filled 2015-01-18: qty 10

## 2015-01-18 MED ORDER — SCOPOLAMINE 1 MG/3DAYS TD PT72
MEDICATED_PATCH | TRANSDERMAL | Status: AC
  Administered 2015-01-18: 1 via TRANSDERMAL
  Filled 2015-01-18: qty 1

## 2015-01-18 MED ORDER — HYDROMORPHONE HCL 1 MG/ML IJ SOLN: 0.2500 mg | INTRAMUSCULAR | Status: DC | PRN

## 2015-01-18 MED ORDER — MIDAZOLAM HCL 2 MG/2ML IJ SOLN
INTRAMUSCULAR | Status: AC
  Filled 2015-01-18: qty 2

## 2015-01-18 MED ORDER — OXYCODONE HCL 5 MG PO TABS
5.0000 mg | ORAL_TABLET | Freq: Once | ORAL | Status: AC | PRN
  Administered 2015-01-18: 5 mg via ORAL

## 2015-01-18 MED ORDER — HYDROCODONE-ACETAMINOPHEN 5-325 MG PO TABS: 1.0000 | ORAL_TABLET | ORAL | Status: DC | PRN

## 2015-01-18 MED ORDER — FENTANYL CITRATE 0.05 MG/ML IJ SOLN
INTRAMUSCULAR | Status: AC
  Filled 2015-01-18: qty 5

## 2015-01-18 MED ORDER — EPHEDRINE SULFATE 50 MG/ML IJ SOLN
INTRAMUSCULAR | Status: AC
  Filled 2015-01-18: qty 1

## 2015-01-18 MED ORDER — PROPOFOL 10 MG/ML IV BOLUS
INTRAVENOUS | Status: AC
  Filled 2015-01-18: qty 20

## 2015-01-18 MED ORDER — MIDAZOLAM HCL 2 MG/2ML IJ SOLN
INTRAMUSCULAR | Status: AC
Start: 2015-01-18 — End: 2015-01-18
  Filled 2015-01-18: qty 2

## 2015-01-18 MED ORDER — BUPIVACAINE-EPINEPHRINE (PF) 0.25% -1:200000 IJ SOLN
INTRAMUSCULAR | Status: AC
Start: 2015-01-18 — End: 2015-01-18
  Filled 2015-01-18: qty 30

## 2015-01-18 MED ORDER — DEXAMETHASONE SODIUM PHOSPHATE 4 MG/ML IJ SOLN
INTRAMUSCULAR | Status: DC | PRN
  Administered 2015-01-18: 4 mg via INTRAVENOUS

## 2015-01-18 MED ORDER — LIDOCAINE HCL (CARDIAC) 20 MG/ML IV SOLN
INTRAVENOUS | Status: AC
  Filled 2015-01-18: qty 5

## 2015-01-18 MED ORDER — LIDOCAINE HCL (CARDIAC) 20 MG/ML IV SOLN
INTRAVENOUS | Status: DC | PRN
  Administered 2015-01-18: 80 mg via INTRAVENOUS

## 2015-01-18 MED ORDER — ARTIFICIAL TEARS OP OINT
TOPICAL_OINTMENT | OPHTHALMIC | Status: AC
  Filled 2015-01-18: qty 3.5

## 2015-01-18 MED ORDER — FENTANYL CITRATE 0.05 MG/ML IJ SOLN
INTRAMUSCULAR | Status: DC | PRN
Start: 2015-01-18 — End: 2015-01-18
  Administered 2015-01-18: 25 ug via INTRAVENOUS
  Administered 2015-01-18: 50 ug via INTRAVENOUS
  Administered 2015-01-18: 25 ug via INTRAVENOUS

## 2015-01-18 MED ORDER — ACETAMINOPHEN 160 MG/5ML PO SOLN: 325.0000 mg | ORAL | Status: DC | PRN

## 2015-01-18 MED ORDER — MIDAZOLAM HCL 5 MG/5ML IJ SOLN
INTRAMUSCULAR | Status: DC | PRN
  Administered 2015-01-18: 2 mg via INTRAVENOUS

## 2015-01-18 MED ORDER — ROPIVACAINE HCL 5 MG/ML IJ SOLN
INTRAMUSCULAR | Status: DC | PRN
  Administered 2015-01-18: 25 mL via PERINEURAL

## 2015-01-18 MED ORDER — DIPHENHYDRAMINE HCL 50 MG/ML IJ SOLN
INTRAMUSCULAR | Status: DC | PRN
  Administered 2015-01-18: 25 mg via INTRAVENOUS

## 2015-01-18 MED ORDER — ONDANSETRON HCL 4 MG/2ML IJ SOLN
INTRAMUSCULAR | Status: AC
  Filled 2015-01-18: qty 2

## 2015-01-18 MED ORDER — PHENYLEPHRINE 40 MCG/ML (10ML) SYRINGE FOR IV PUSH (FOR BLOOD PRESSURE SUPPORT)
PREFILLED_SYRINGE | INTRAVENOUS | Status: AC
  Filled 2015-01-18: qty 10

## 2015-01-18 MED ORDER — ONDANSETRON HCL 4 MG/2ML IJ SOLN
INTRAMUSCULAR | Status: DC | PRN
  Administered 2015-01-18 (×2): 4 mg via INTRAVENOUS

## 2015-01-18 MED ORDER — FENTANYL CITRATE 0.05 MG/ML IJ SOLN
INTRAMUSCULAR | Status: AC
  Filled 2015-01-18: qty 2

## 2015-01-18 SURGICAL SUPPLY — 70 items
BANDAGE ELASTIC 3 VELCRO ST LF (GAUZE/BANDAGES/DRESSINGS) ×2 IMPLANT
BIT DRILL 2 FAST STEP (BIT) ×1 IMPLANT
BIT DRILL CALIBRATED 2.7 (BIT) ×1 IMPLANT
BLADE OSC/SAG 11.5X5.5X.38 (BLADE) ×1 IMPLANT
BNDG CMPR 9X4 STRL LF SNTH (GAUZE/BANDAGES/DRESSINGS) ×1
BNDG ESMARK 4X9 LF (GAUZE/BANDAGES/DRESSINGS) ×2 IMPLANT
BNDG GAUZE ELAST 4 BULKY (GAUZE/BANDAGES/DRESSINGS) ×5 IMPLANT
CUFF TOURNIQUET SINGLE 18IN (TOURNIQUET CUFF) ×2 IMPLANT
DRAPE INCISE IOBAN 66X45 STRL (DRAPES) IMPLANT
DRAPE OEC MINIVIEW 54X84 (DRAPES) ×2 IMPLANT
DURAPREP 26ML APPLICATOR (WOUND CARE) ×2 IMPLANT
ELECT REM PT RETURN 9FT ADLT (ELECTROSURGICAL) ×2
ELECTRODE REM PT RTRN 9FT ADLT (ELECTROSURGICAL) ×1 IMPLANT
GAUZE SPONGE 4X4 12PLY STRL (GAUZE/BANDAGES/DRESSINGS) ×2 IMPLANT
GAUZE XEROFORM 5X9 LF (GAUZE/BANDAGES/DRESSINGS) ×2 IMPLANT
GLOVE BIOGEL PI IND STRL 8 (GLOVE) ×1 IMPLANT
GLOVE BIOGEL PI IND STRL 8.5 (GLOVE) IMPLANT
GLOVE BIOGEL PI INDICATOR 8 (GLOVE) ×1
GLOVE BIOGEL PI INDICATOR 8.5 (GLOVE)
GLOVE ECLIPSE 8.0 STRL XLNG CF (GLOVE) ×2 IMPLANT
GLOVE SURG ORTHO 8.5 STRL (GLOVE) IMPLANT
GOWN STRL REUS W/ TWL LRG LVL3 (GOWN DISPOSABLE) ×2 IMPLANT
GOWN STRL REUS W/ TWL XL LVL3 (GOWN DISPOSABLE) ×1 IMPLANT
GOWN STRL REUS W/TWL LRG LVL3 (GOWN DISPOSABLE) ×4
GOWN STRL REUS W/TWL XL LVL3 (GOWN DISPOSABLE) ×2
IMPL HEAD (Orthopedic Implant) IMPLANT
IMPL STEM W/SCREW 7X26MM (Stem) IMPLANT
IMPLANT HEAD (Orthopedic Implant) ×2 IMPLANT
IMPLANT STEM W/SCREW 7X26MM (Stem) ×2 IMPLANT
K-WIRE ACE 1.6X6 (WIRE) ×8
KIT ROOM TURNOVER OR (KITS) ×2 IMPLANT
KWIRE ACE 1.6X6 (WIRE) IMPLANT
NEEDLE 22X1 1/2 (OR ONLY) (NEEDLE) IMPLANT
PACK ORTHO EXTREMITY (CUSTOM PROCEDURE TRAY) ×2 IMPLANT
PAD ARMBOARD 7.5X6 YLW CONV (MISCELLANEOUS) ×4 IMPLANT
PAD CAST 3X4 CTTN HI CHSV (CAST SUPPLIES) IMPLANT
PAD CAST 4YDX4 CTTN HI CHSV (CAST SUPPLIES) ×1 IMPLANT
PADDING CAST COTTON 3X4 STRL (CAST SUPPLIES) ×4
PADDING CAST COTTON 4X4 STRL (CAST SUPPLIES) ×2
PENCIL BUTTON HOLSTER BLD 10FT (ELECTRODE) ×2 IMPLANT
PLATE OLECRANON LRG (Plate) ×1 IMPLANT
SCREW LOCK CORT STAR 3.5X10 (Screw) ×1 IMPLANT
SCREW LOCK CORT STAR 3.5X12 (Screw) ×1 IMPLANT
SCREW LOCK CORT STAR 3.5X18 (Screw) ×2 IMPLANT
SCREW LOW PROFILE 18MMX3.5MM (Screw) ×2 IMPLANT
SCREW LP 3.5X44 (Screw) ×1 IMPLANT
SCREW NON LOCKING LP 3.5 16MM (Screw) ×2 IMPLANT
SCREW PEG 2.5X16 NONLOCK (Screw) ×1 IMPLANT
SCREW PEG 2.5X18 NONLOCK (Screw) ×1 IMPLANT
SLING ARM FOAM STRAP MED (SOFTGOODS) ×1 IMPLANT
SPLINT PLASTER CAST XFAST 5X30 (CAST SUPPLIES) IMPLANT
SPLINT PLASTER XFAST SET 5X30 (CAST SUPPLIES) ×2
SPONGE GAUZE 4X4 12PLY STER LF (GAUZE/BANDAGES/DRESSINGS) ×1 IMPLANT
SPONGE LAP 4X18 X RAY DECT (DISPOSABLE) ×2 IMPLANT
SUCTION FRAZIER TIP 10 FR DISP (SUCTIONS) ×2 IMPLANT
SUT MNCRL AB 3-0 PS2 18 (SUTURE) ×1 IMPLANT
SUT MON AB 3-0 SH 27 (SUTURE) ×2
SUT MON AB 3-0 SH27 (SUTURE) IMPLANT
SUT VIC AB 0 CT1 27 (SUTURE) ×4
SUT VIC AB 0 CT1 27XBRD ANBCTR (SUTURE) IMPLANT
SUT VIC AB 1 CT1 27 (SUTURE) ×2
SUT VIC AB 1 CT1 27XBRD ANBCTR (SUTURE) ×1 IMPLANT
SUT VIC AB 2-0 CT1 27 (SUTURE) ×2
SUT VIC AB 2-0 CT1 TAPERPNT 27 (SUTURE) ×1 IMPLANT
SYR CONTROL 10ML LL (SYRINGE) IMPLANT
TOWEL OR 17X24 6PK STRL BLUE (TOWEL DISPOSABLE) ×2 IMPLANT
TOWEL OR 17X26 10 PK STRL BLUE (TOWEL DISPOSABLE) ×3 IMPLANT
TUBE CONNECTING 12X1/4 (SUCTIONS) ×2 IMPLANT
WASHER 3.5MM (Orthopedic Implant) ×1 IMPLANT
YANKAUER SUCT BULB TIP NO VENT (SUCTIONS) IMPLANT

## 2015-01-18 NOTE — Anesthesia Preprocedure Evaluation (Addendum)
Anesthesia Evaluation  Patient identified by MRN, date of birth, ID band Patient awake    Reviewed: Allergy & Precautions, NPO status , Patient's Chart, lab work & pertinent test results  History of Anesthesia Complications (+) PONV and history of anesthetic complications  Airway Mallampati: II  TM Distance: >3 FB Neck ROM: Full    Dental  (+) Teeth Intact, Dental Advisory Given   Pulmonary neg pulmonary ROS,    Pulmonary exam normal       Cardiovascular negative cardio ROS  Rhythm:Regular Rate:Normal     Neuro/Psych Depression Hx of Bells Palsy, right side    GI/Hepatic Neg liver ROS, GERD-  Medicated and Controlled,  Endo/Other  negative endocrine ROS  Renal/GU negative Renal ROS     Musculoskeletal   Abdominal   Peds  Hematology negative hematology ROS (+)   Anesthesia Other Findings   Reproductive/Obstetrics                             Anesthesia Physical Anesthesia Plan  ASA: II  Anesthesia Plan: General   Post-op Pain Management:    Induction: Intravenous  Airway Management Planned: LMA and Oral ETT  Additional Equipment: None  Intra-op Plan:   Post-operative Plan: Extubation in OR  Informed Consent: I have reviewed the patients History and Physical, chart, labs and discussed the procedure including the risks, benefits and alternatives for the proposed anesthesia with the patient or authorized representative who has indicated his/her understanding and acceptance.   Dental advisory given  Plan Discussed with: Surgeon and CRNA  Anesthesia Plan Comments:        Anesthesia Quick Evaluation

## 2015-01-18 NOTE — Anesthesia Procedure Notes (Addendum)
Anesthesia Regional Block:  Supraclavicular block  Pre-Anesthetic Checklist: ,, timeout performed, Correct Patient, Correct Site, Correct Laterality, Correct Procedure, Correct Position, site marked, Risks and benefits discussed,  Surgical consent,  Pre-op evaluation,  At surgeon's request and post-op pain management  Laterality: Upper and Left  Prep: chloraprep       Needles:  Injection technique: Single-shot  Needle Type: Echogenic Needle          Additional Needles:  Procedures: ultrasound guided (picture in chart) Supraclavicular block Narrative:  Injection made incrementally with aspirations every 5 mL.  Performed by: Personally   Additional Notes: H+P and labs reviewed, risks and benefits discussed with patient, procedure tolerated well without complications   Procedure Name: LMA Insertion Date/Time: 01/18/2015 1:03 PM Performed by: [place] Pink Pre-anesthesia Checklist: Patient identified, Emergency Drugs available, Suction available and Patient being monitored Patient Re-evaluated:Patient Re-evaluated prior to inductionOxygen Delivery Method: Circle system utilized Preoxygenation: Pre-oxygenation with 100% oxygen Intubation Type: IV induction LMA: LMA inserted LMA Size: 4.0 Number of attempts: 1 Placement Confirmation: positive ETCO2 and breath sounds checked- equal and bilateral Tube secured with: Tape Dental Injury: Teeth and Oropharynx as per pre-operative assessment

## 2015-01-18 NOTE — Op Note (Signed)
PATIENT ID:      [name]  MRN:     [id number] DOB/AGE:    01-15-1951 / 64 y.o.       OPERATIVE REPORT    DATE OF PROCEDURE:  01/18/2015       PREOPERATIVE DIAGNOSIS:   COMMINUTED,DISPLACED, FRACTURES OF  LEFT OLECRANON AND RADIAL HEAD                                                       Estimated body mass index is 25.29 kg/(m^2) as calculated from the following:   Height as of this encounter: 5\' 5"  (1.651 m).   Weight as of this encounter: 68.947 kg (152 lb).     POSTOPERATIVE DIAGNOSIS: SAME                                                                   Estimated body mass index is 25.29 kg/(m^2) as calculated from the following:   Height as of this encounter: 5\' 5"  (1.651 m).   Weight as of this encounter: 68.947 kg (152 lb).     PROCEDURE:  Procedure(s): OPEN REDUCTION INTERNAL FIXATION (ORIF) OLECRANON FRACTURE,RADIAL HEAD IMPLANT LEFT ELBOW     SURGEON:  Joni Fears, MD    ASSISTANT:   Biagio Borg, PA-C   (Present and scrubbed throughout the case, critical for assistance with exposure, retraction, instrumentation, and closure.)          ANESTHESIA: regional, GENERAL     DRAINS: none :      TOURNIQUET TIME:  Total Tourniquet Time Documented: Upper Arm (Left) - 102 minutes Total: Upper Arm (Left) - 102 minutes     COMPLICATIONS:  None   CONDITION:  stable  PROCEDURE IN DETAIL: 101751   [name] W 01/18/2015, 4:03 PM

## 2015-01-18 NOTE — Transfer of Care (Signed)
Immediate Anesthesia Transfer of Care Note  Patient: Messina Kosinski  Procedure(s) Performed: Procedure(s) with comments: OPEN REDUCTION INTERNAL FIXATION (ORIF) ULNAR FRACTURE, POSSIBLE RADIAL HEAD IMPLANT. (Left) - ORIF LEFT PROXIMAL ULNA, POSSIBLE RADIAL HEAD IMPLANT VS ORIF.  Patient Location: PACU  Anesthesia Type:GA combined with regional for post-op pain  Level of Consciousness: awake, alert  and oriented  Airway & Oxygen Therapy: Patient Spontanous Breathing and Patient connected to nasal cannula oxygen  Post-op Assessment: Report given to RN and Post -op Vital signs reviewed and stable  Post vital signs: Reviewed and stable  Last Vitals:  Filed Vitals:   01/18/15 1004  BP:   Pulse:   Temp: 37.2 C  Resp:     Complications: No apparent anesthesia complications

## 2015-01-18 NOTE — Progress Notes (Signed)
Patient ID: [name], female   DOB: 15-Nov-1951, 64 y.o.   MRN: [id number] The recent History & Physical has been reviewed. I have personally examined the patient today. There is no interval change to the documented History & Physical. The patient would like to proceed with the procedure.  Joni Fears W 01/18/2015,  12:46 PM

## 2015-01-18 NOTE — Discharge Instructions (Signed)
°What to eat: ° °For your first meals, you should eat lightly; only small meals initially.  If you do not have nausea, you may eat larger meals.  Avoid spicy, greasy and heavy food.   ° °General Anesthesia, Adult, Care After  °Refer to this sheet in the next few weeks. These instructions provide you with information on caring for yourself after your procedure. Your health care provider may also give you more specific instructions. Your treatment has been planned according to current medical practices, but problems sometimes occur. Call your health care provider if you have any problems or questions after your procedure.  °WHAT TO EXPECT AFTER THE PROCEDURE  °After the procedure, it is typical to experience:  °Sleepiness.  °Nausea and vomiting. °HOME CARE INSTRUCTIONS  °For the first 24 hours after general anesthesia:  °Have a responsible person with you.  °Do not drive a car. If you are alone, do not take public transportation.  °Do not drink alcohol.  °Do not take medicine that has not been prescribed by your health care provider.  °Do not sign important papers or make important decisions.  °You may resume a normal diet and activities as directed by your health care provider.  °Change bandages (dressings) as directed.  °If you have questions or problems that seem related to general anesthesia, call the hospital and ask for the anesthetist or anesthesiologist on call. °SEEK MEDICAL CARE IF:  °You have nausea and vomiting that continue the day after anesthesia.  °You develop a rash. °SEEK IMMEDIATE MEDICAL CARE IF:  °You have difficulty breathing.  °You have chest pain.  °You have any allergic problems. °Document Released: 02/16/2001 Document Revised: 07/13/2013 Document Reviewed: 05/26/2013  °ExitCare® Patient Information ©2014 [employer].  ° °Sore Throat  ° ° °A sore throat is a painful, burning, sore, or scratchy feeling of the throat. There may be pain or tenderness when swallowing or talking. You may have  other symptoms with a sore throat. These include coughing, sneezing, fever, or a swollen neck. A sore throat is often the first sign of another sickness. These sicknesses may include a cold, flu, strep throat, or an infection called mono. Most sore throats go away without medical treatment.  °HOME CARE  °Only take medicine as told by your doctor.  °Drink enough fluids to keep your pee (urine) clear or pale yellow.  °Rest as needed.  °Try using throat sprays, lozenges, or suck on hard candy (if older than 4 years or as told).  °Sip warm liquids, such as broth, herbal tea, or warm water with honey. Try sucking on frozen ice pops or drinking cold liquids.  °Rinse the mouth (gargle) with salt water. Mix 1 teaspoon salt with 8 ounces of water.  °Do not smoke. Avoid being around others when they are smoking.  °Put a humidifier in your bedroom at night to moisten the air. You can also turn on a hot shower and sit in the bathroom for 5-10 minutes. Be sure the bathroom door is closed. °GET HELP RIGHT AWAY IF:  °You have trouble breathing.  °You cannot swallow fluids, soft foods, or your spit (saliva).  °You have more puffiness (swelling) in the throat.  °Your sore throat does not get better in 7 days.  °You feel sick to your stomach (nauseous) and throw up (vomit).  °You have a fever or lasting symptoms for more than 2-3 days.  °You have a fever and your symptoms suddenly get worse. °MAKE SURE YOU:  °Understand these   instructions.  °Will watch your condition.  °Will get help right away if you are not doing well or get worse. °Document Released: 08/19/2008 Document Revised: 08/04/2012 Document Reviewed: 07/18/2012  °Exit[employer]® Patient Information ©2015 [employer]. This information is not intended to replace advice given to you by your health care provider. Make sure you discuss any questions you have with your health care provider.  ° ° ° °

## 2015-01-19 ENCOUNTER — Encounter (HOSPITAL_COMMUNITY): Payer: Self-pay | Admitting: Orthopaedic Surgery

## 2015-01-19 NOTE — Op Note (Signed)
[name], [name] Cruz               ACCOUNT NO.:  [id number]  MEDICAL RECORD NO.:  [id number]  LOCATION:  [facility]                         FACILITY:  [facility]  PHYSICIAN:  Vonna Kotyk. Senia Even, M.D.DATE OF BIRTH:  03/17/1951  DATE OF PROCEDURE:  01/18/2015 DATE OF DISCHARGE:  01/18/2015                              OPERATIVE REPORT   PREOPERATIVE DIAGNOSES: 1. Closed comminuted displaced left olecranon fracture. 2. Closed comminuted displaced left radial head fracture.  POSTOPERATIVE DIAGNOSES: 1. Closed comminuted displaced left olecranon fracture. 2. Closed comminuted displaced left radial head fracture.  PROCEDURE: 1. Open reduction and internal fixation of left olecranon fracture. 2. Removal of multiple radial head fragments from elbow joint with     radial head implant.  SURGEON:  Vonna Kotyk. Durward Fortes, MD  ASSISTANT:  Mike Craze. Petrarca, PA-C, was present throughout the operative procedure to ensure its timely completion.  ANESTHESIA:  Interscalene nerve block and general anesthetic.  COMPLICATIONS:  None.  DESCRIPTION OF PROCEDURE:  [name] Cruz was met in the holding area with her husband, identified the left upper extremity as the appropriate operative site and marked it accordingly.  I had seen her in the office several days ago.  Neurovascular exam was intact to the hand.  She had the interscalene nerve block prior to my evaluation today, but she had an excellent capillary refill to the fingers and she was still in the posterior splint.  Any questions were answered.  The patient was then transported to room #4 and placed comfortably on the operating room table and then under general anesthesia without difficulty.  Left upper extremity splint was removed.  There was some areas of ecchymosis, but otherwise were intact.  There was some mild swelling in the dorsum of her hand.  Tourniquet was applied proximally to the left arm with the arm elevated, it was prepped with  chlorhexidine scrub, from the tips of the fingers to the tourniquet followed by DuraPrep scrub. Sterile draping was performed.  With the extremity elevated, it was Esmarch, exsanguinated with a proximal tourniquet at 250 mmHg.  Time-out was called.  Incision was made paralleling the proximal ulna starting at the olecranon coursing distally, approximately 4-5 inch excision and via sharp dissection, carried down through the subcutaneous tissue.  There was some hematoma at that point, and then via blunt dissection, the subcutaneous tissue and superficial fascia was dissected from the proximal ulnar.  There was obvious hematoma from the olecranon fracture site.  A subperiosteal dissection was then performed on both sides of the ulna over an area of about a cm.  I could open the gap within the olecranon fracture that was somewhat comminuted.  There were several nondisplaced longitudinal fracture within the main transverse fracture, these were irrigated.  The range of motion was limited based on the multiple radial head fragments within the joint.  At that point, I found the interval between the anconeus and the extensor carpi ulnaris tendon.  This was developed sharply then via blunt dissection, I carried this down into the elbow joint, the large hemarthrosis was evacuated.  With this slight traction distally, I could then visualize the joint. There were no parts of the radial head  that was still attached to the radius.  Under direct visualization, we found the fragments and then reconstituted to be sure that we had all the fragments.  The elbow joint was then irrigated.  I did a cleanup cut on the proximal radius at an angle perpendicular to the shaft.  At that point, we turned our direction to ORIF of the olecranon fracture.  There was a large butterfly fragment incorporating the coronoid.  Under direct visualization, we were able to reduce the 2 main fragments and maintained  with a bone clamp and then with further subperiosteal dissection, we were able to find the large butterfly fragment that involved part of the olecranon medially, this was released bluntly and then replaced in an anatomic position.  We then placed a K-wire across the entire construct and felt we had excellent position, we checked it with image intensification.  Two transverse transfixing screws were then placed through the proximal ulna to transfix the large butterfly fragment and the coronoid, this provided excellent stability, we thought we had anatomic position. The Biomet long olecranon plate was then affixed to the olecranon, screws were placed proximally and distally.  We then checked the image intensification to be sure that we were in appropriate position. Further screws were then inserted bicortically using a combination of locking and nonlocking screws.  No screws were placed through the fracture site.  We used the plate benders to contour the very proximal end of the plate to the olecranon and then inserted the home-run screw through the coronoid to further secure the fracture.  At that point, we checked the arm through a full range of motion and felt that there was no instability or motion across the fracture site.  The wound was then copiously irrigated with saline solution.  We thought we had anatomic position by film and by direct vision.  The muscle fascia was then closed over the plate and the olecranon beginning proximally extending distally with complete covering of the plate.  We then irrigated the joint, delivered the proximal radius into the wound and then again irrigated the joint.  We trialed several sized radial head shafts, #7 was the appropriate size.  With the trial shaft in place, we then inserted a trial radial head #10 correspond to the size of the radial head fragments that we removed.  We used the shortest neck.  We then screwed this in place,  reduced the joint through a full range of motion.  We had full pronation, supination, flexion, extension. I did not feel like it was too tight and knowing that I think it was too loose, so then removed the trial components and inserted the final components.  The #7 shaft was impacted and then we screwed the #10 radial head in place.  The joint was explored.  There was no evidence of any further loose material.  The capsule remained intact.  The entire construct was reduced and again through a full range of motion, we had excellent motion.  I did release the tourniquet at 102 minutes after affixing the olecranon and then continued the rest of procedure without a tourniquet.  Any bleeding was controlled with the Bovie.  The interval incision between the anconeus and the extensor carpi radialis was then closed with a running 0 Vicryl as was the incision over the olecranon and olecranon plate, had a very nice apposition.  We closed the skin with 3-0 Monocryl and then skin clips.  Sterile bulky dressing was applied followed  by a posterior splint with the forearm in neutral.  It was very well padded.  The patient was then awoken and returned to the postanesthesia recovery room without complications.  She did receive a g of vancomycin preoperatively and had an excellent interscalene nerve block.  COMPONENTS:  For the radial head replacement with a Biomet modular radial stem with a screw size 7 with a 26 mm length, was set 10 mm height and screw was used to secure the head to the radial head shaft. Postoperative x-rays revealed excellent position of all of the components and no evidence of any further extraneous bone within the joint.     Vonna Kotyk. Durward Fortes, M.D.     PWW/MEDQ  D:  01/18/2015  T:  01/19/2015  Job:  [id number]

## 2015-01-20 NOTE — Anesthesia Postprocedure Evaluation (Signed)
  Anesthesia Post-op Note  Patient: [name]  Procedure(s) Performed: Procedure(s) with comments: OPEN REDUCTION INTERNAL FIXATION (ORIF) ULNAR FRACTURE, POSSIBLE RADIAL HEAD IMPLANT. (Left) - ORIF LEFT PROXIMAL ULNA, POSSIBLE RADIAL HEAD IMPLANT VS ORIF.  Patient Location: PACU  Anesthesia Type:GA combined with regional for post-op pain  Level of Consciousness: awake and alert   Airway and Oxygen Therapy: Patient Spontanous Breathing  Post-op Pain: mild  Post-op Assessment: Post-op Vital signs reviewed  Post-op Vital Signs: stable  Last Vitals:  Filed Vitals:   01/18/15 1755  BP:   Pulse: 97  Temp:   Resp:     Complications: No apparent anesthesia complications

## 2015-02-14 ENCOUNTER — Encounter
Admit: 2015-02-14 | Disposition: A | Discharge status: Home / Self Care | Payer: Self-pay | Attending: Orthopaedic Surgery | Admitting: Orthopaedic Surgery

## 2015-02-23 ENCOUNTER — Encounter
Admit: 2015-02-23 | Disposition: A | Discharge status: Home / Self Care | Payer: Self-pay | Attending: Orthopaedic Surgery | Admitting: Orthopaedic Surgery

## 2015-03-27 ENCOUNTER — Ambulatory Visit: Payer: Medicare Other | Attending: Orthopaedic Surgery | Admitting: Occupational Therapy

## 2015-03-27 ENCOUNTER — Encounter: Payer: Self-pay | Admitting: Occupational Therapy

## 2015-03-27 DIAGNOSIS — S52022S Displaced fracture of olecranon process without intraarticular extension of left ulna, sequela: Secondary | ICD-10-CM | POA: Insufficient documentation

## 2015-03-27 DIAGNOSIS — S52022D Displaced fracture of olecranon process without intraarticular extension of left ulna, subsequent encounter for closed fracture with routine healing: Secondary | ICD-10-CM | POA: Diagnosis present

## 2015-03-27 DIAGNOSIS — X58XXXD Exposure to other specified factors, subsequent encounter: Secondary | ICD-10-CM | POA: Insufficient documentation

## 2015-03-27 DIAGNOSIS — M25532 Pain in left wrist: Secondary | ICD-10-CM | POA: Diagnosis not present

## 2015-03-27 DIAGNOSIS — M25642 Stiffness of left hand, not elsewhere classified: Secondary | ICD-10-CM | POA: Insufficient documentation

## 2015-03-27 DIAGNOSIS — M79642 Pain in left hand: Secondary | ICD-10-CM | POA: Diagnosis not present

## 2015-03-27 DIAGNOSIS — M25622 Stiffness of left elbow, not elsewhere classified: Secondary | ICD-10-CM | POA: Insufficient documentation

## 2015-03-27 DIAGNOSIS — M25632 Stiffness of left wrist, not elsewhere classified: Secondary | ICD-10-CM | POA: Diagnosis not present

## 2015-03-27 NOTE — Patient Instructions (Signed)
1) Chair Push-Up off bed   Lift buttocks off seat of chair by pushing down with arms. Repeat __8__ times. Do _2-3 times a day.  2) Cane Overhead - Supine   Hold cane at thighs with both hands, extend arms straight over head. Hold _3_ seconds. Repeat 5-8___ times. Do _2-3__ times per day.  Copyright  VHI. All rights reserved  3) Milon Dikes: Lay on your back on bed and slide left arm out to side and overhead like you were making a snow angel. Repeat 5-8 times, 2-3 times a day.   Pt was also educated verbally to d/c D1 pattern exercises with theraband, also to back off on bicep curls at this time. She verbalized understanding of all of the above.
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
## 2015-03-30 ENCOUNTER — Encounter: Payer: Self-pay | Admitting: Occupational Therapy

## 2015-03-30 ENCOUNTER — Ambulatory Visit: Payer: Medicare Other | Admitting: Occupational Therapy

## 2015-03-30 DIAGNOSIS — S52022S Displaced fracture of olecranon process without intraarticular extension of left ulna, sequela: Secondary | ICD-10-CM | POA: Diagnosis not present

## 2015-03-30 DIAGNOSIS — M79622 Pain in left upper arm: Secondary | ICD-10-CM

## 2015-03-30 DIAGNOSIS — M25622 Stiffness of left elbow, not elsewhere classified: Secondary | ICD-10-CM

## 2015-03-30 NOTE — Patient Instructions (Signed)
Did with pt pulley's coming in - was doing flexion of shoulders but not abd  AND repeat at end of session - 20 reps of shoulder flexion and scaption - to do first that before adding ABD - add to HEP with just gentle stretch   Green putty add for gripping 3 point and lat grip and twisting BW /FW for supination/pronation   only 10 reps not to over do it
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
## 2015-04-04 ENCOUNTER — Ambulatory Visit: Payer: Medicare Other | Admitting: Occupational Therapy

## 2015-04-04 DIAGNOSIS — S52022S Displaced fracture of olecranon process without intraarticular extension of left ulna, sequela: Secondary | ICD-10-CM | POA: Diagnosis not present

## 2015-04-04 DIAGNOSIS — M79622 Pain in left upper arm: Secondary | ICD-10-CM

## 2015-04-04 DIAGNOSIS — M25622 Stiffness of left elbow, not elsewhere classified: Secondary | ICD-10-CM

## 2015-04-04 NOTE — Patient Instructions (Signed)
Add to HEP scapula ADD on wall  Keep scapula on wall and do scaption of shoulder to 90 degrees - keeping scapulas on wall   Push thru rolled up towels on either side of her on mat  For elbow exention and depression of scapula  Pulley's for shoulder flexion and scaption
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
## 2015-04-06 ENCOUNTER — Ambulatory Visit: Payer: Medicare Other | Admitting: Occupational Therapy

## 2015-04-06 DIAGNOSIS — M25622 Stiffness of left elbow, not elsewhere classified: Secondary | ICD-10-CM

## 2015-04-06 DIAGNOSIS — M79622 Pain in left upper arm: Secondary | ICD-10-CM

## 2015-04-06 DIAGNOSIS — S52022S Displaced fracture of olecranon process without intraarticular extension of left ulna, sequela: Secondary | ICD-10-CM | POA: Diagnosis not present
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
## 2015-04-06 NOTE — Patient Instructions (Signed)
Hand gripper with 10 lbs to do 20 reps and hold off on putty - Can do hammer holding it lower if not increasing pain   Cont with shoulder exercises from last time

## 2015-04-10 ENCOUNTER — Ambulatory Visit: Payer: Medicare Other | Admitting: Occupational Therapy

## 2015-04-10 DIAGNOSIS — S52022S Displaced fracture of olecranon process without intraarticular extension of left ulna, sequela: Secondary | ICD-10-CM | POA: Diagnosis not present

## 2015-04-10 DIAGNOSIS — M79622 Pain in left upper arm: Secondary | ICD-10-CM

## 2015-04-10 DIAGNOSIS — M25622 Stiffness of left elbow, not elsewhere classified: Secondary | ICD-10-CM

## 2015-04-10 NOTE — Patient Instructions (Signed)
Cont with same HEP  
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
## 2015-04-13 ENCOUNTER — Ambulatory Visit: Payer: Medicare Other | Admitting: Occupational Therapy

## 2015-04-13 DIAGNOSIS — M25622 Stiffness of left elbow, not elsewhere classified: Secondary | ICD-10-CM

## 2015-04-13 DIAGNOSIS — M79622 Pain in left upper arm: Secondary | ICD-10-CM

## 2015-04-13 DIAGNOSIS — S52022S Displaced fracture of olecranon process without intraarticular extension of left ulna, sequela: Secondary | ICD-10-CM | POA: Diagnosis not present
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
## 2015-04-13 NOTE — Patient Instructions (Signed)
Reinforce correct tech for scapular retraction and shoulder scaption 90 degrees with scapulas on wall   Hold off on push ups next to her  - do not had correct height at home and compensating with shoulder elevation

## 2015-04-16 ENCOUNTER — Ambulatory Visit: Payer: Medicare Other | Admitting: Occupational Therapy

## 2015-04-16 DIAGNOSIS — M25622 Stiffness of left elbow, not elsewhere classified: Secondary | ICD-10-CM

## 2015-04-16 DIAGNOSIS — M79622 Pain in left upper arm: Secondary | ICD-10-CM

## 2015-04-16 DIAGNOSIS — S52022S Displaced fracture of olecranon process without intraarticular extension of left ulna, sequela: Secondary | ICD-10-CM | POA: Diagnosis not present
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
## 2015-04-16 NOTE — Patient Instructions (Signed)
Upgrade to green putty and RTB for elbow extention   Husband can do rhitmic extention stretch at end range - take time

## 2015-04-18 ENCOUNTER — Ambulatory Visit: Payer: Medicare Other | Admitting: Occupational Therapy

## 2015-04-18 ENCOUNTER — Encounter: Payer: Medicare Other | Admitting: Occupational Therapy

## 2015-04-18 DIAGNOSIS — M25622 Stiffness of left elbow, not elsewhere classified: Secondary | ICD-10-CM

## 2015-04-18 DIAGNOSIS — M79622 Pain in left upper arm: Secondary | ICD-10-CM

## 2015-04-18 DIAGNOSIS — S52022S Displaced fracture of olecranon process without intraarticular extension of left ulna, sequela: Secondary | ICD-10-CM | POA: Diagnosis not present
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
## 2015-04-18 NOTE — Patient Instructions (Signed)
Same HEP than last time

## 2015-04-20 ENCOUNTER — Encounter: Payer: Medicare Other | Admitting: Occupational Therapy

## 2015-04-25 ENCOUNTER — Ambulatory Visit: Payer: Medicare Other | Attending: Orthopaedic Surgery | Admitting: Occupational Therapy

## 2015-04-25 DIAGNOSIS — M25622 Stiffness of left elbow, not elsewhere classified: Secondary | ICD-10-CM | POA: Diagnosis present

## 2015-04-25 DIAGNOSIS — M79622 Pain in left upper arm: Secondary | ICD-10-CM | POA: Insufficient documentation

## 2015-04-25 NOTE — Patient Instructions (Signed)
Pt to work end range for extention after JAS pushing into towel - in 3 positions  5 reps each - scapulas on wall  JAS 2 x day 30 -40 min - position shoulder relax  Ed on keeping pressure off cubital tunnel - flexion more than 90 degrees and propping arm up on elbow   Cont with flexion too
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
## 2015-04-30 ENCOUNTER — Ambulatory Visit: Payer: Medicare Other | Admitting: Occupational Therapy

## 2015-04-30 DIAGNOSIS — M25622 Stiffness of left elbow, not elsewhere classified: Secondary | ICD-10-CM | POA: Diagnosis not present

## 2015-04-30 DIAGNOSIS — M79622 Pain in left upper arm: Secondary | ICD-10-CM
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
## 2015-04-30 NOTE — Patient Instructions (Signed)
Pt t o do end range extention in 3 positions agains wall - into towel roll - after use of JAS splint   Bring JAS splint in next session

## 2015-05-02 ENCOUNTER — Ambulatory Visit: Payer: Medicare Other | Admitting: Occupational Therapy

## 2015-05-02 DIAGNOSIS — M25622 Stiffness of left elbow, not elsewhere classified: Secondary | ICD-10-CM | POA: Diagnosis not present

## 2015-05-02 DIAGNOSIS — M79622 Pain in left upper arm: Secondary | ICD-10-CM

## 2015-05-02 NOTE — Patient Instructions (Signed)
To increase hand gripper 5 lbs at time -and info provided about each color is different amount of lbs   Padding for edge of JAS splint cuffs at upper arm and proximal forearm to increase comfort and be able to feel pull at elbow prior to cuffs cutting into her   Had PT Lelan Pons) screen pt's shoulder to give her some HEP to do to increase ROM  And decrease pain
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
## 2015-05-08 ENCOUNTER — Ambulatory Visit: Payer: Medicare Other | Admitting: Occupational Therapy

## 2015-05-10 ENCOUNTER — Ambulatory Visit: Payer: Medicare Other | Admitting: Occupational Therapy

## 2015-05-10 DIAGNOSIS — M25622 Stiffness of left elbow, not elsewhere classified: Secondary | ICD-10-CM | POA: Diagnosis not present

## 2015-05-10 DIAGNOSIS — M79622 Pain in left upper arm: Secondary | ICD-10-CM

## 2015-05-10 NOTE — Patient Instructions (Signed)
Do JAS splint but try supination position and cont with water aerobics - but not equipment in hand   Hand gripper at 10 lbs - one red band   Cont with massage therapy 1 x wk
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
## 2015-05-14 ENCOUNTER — Encounter: Payer: Medicare Other | Admitting: Occupational Therapy

## 2015-05-17 ENCOUNTER — Encounter: Payer: Medicare Other | Admitting: Occupational Therapy

## 2015-05-17 ENCOUNTER — Ambulatory Visit: Payer: Medicare Other | Admitting: Occupational Therapy

## 2015-05-17 DIAGNOSIS — M25622 Stiffness of left elbow, not elsewhere classified: Secondary | ICD-10-CM

## 2015-05-17 DIAGNOSIS — M79622 Pain in left upper arm: Secondary | ICD-10-CM
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
## 2015-05-17 NOTE — Patient Instructions (Signed)
Heat in sitting in supination with light weight and support under upper arm to increase extention   Cont JAS elbow splint for extention

## 2015-05-21 ENCOUNTER — Encounter: Payer: Medicare Other | Admitting: Occupational Therapy

## 2015-05-23 ENCOUNTER — Ambulatory Visit: Payer: Medicare Other | Admitting: Occupational Therapy

## 2015-05-23 DIAGNOSIS — M79622 Pain in left upper arm: Secondary | ICD-10-CM

## 2015-05-23 DIAGNOSIS — M25622 Stiffness of left elbow, not elsewhere classified: Secondary | ICD-10-CM

## 2015-05-23 NOTE — Patient Instructions (Signed)
Pt going to be out of country for [place] of weeks - just using it functionally
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
## 2015-06-11 ENCOUNTER — Encounter: Payer: Medicare Other | Admitting: Occupational Therapy

## 2015-06-12 ENCOUNTER — Ambulatory Visit: Payer: Medicare Other | Attending: Orthopaedic Surgery | Admitting: Occupational Therapy

## 2015-06-12 DIAGNOSIS — M25622 Stiffness of left elbow, not elsewhere classified: Secondary | ICD-10-CM | POA: Diagnosis present

## 2015-06-12 DIAGNOSIS — M79622 Pain in left upper arm: Secondary | ICD-10-CM

## 2015-06-12 NOTE — Patient Instructions (Signed)
Pt to use JAS splint still 2 x day but split up 20 min /56min with hand in sup /pronation  And then cont with the exercise class in pool and change resistance and depth of water one thing at a time and give it at least 48 hrs before adding or changing some thing else
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
## 2015-06-14 ENCOUNTER — Encounter: Payer: Medicare Other | Admitting: Occupational Therapy

## 2015-07-22 ENCOUNTER — Encounter: Payer: Self-pay | Admitting: Internal Medicine

## 2015-07-26 ENCOUNTER — Ambulatory Visit (INDEPENDENT_AMBULATORY_CARE_PROVIDER_SITE_OTHER): Payer: Medicare Other | Admitting: Primary Care

## 2015-07-26 ENCOUNTER — Encounter: Payer: Self-pay | Admitting: Primary Care

## 2015-07-26 ENCOUNTER — Telehealth: Payer: Self-pay | Admitting: Internal Medicine

## 2015-07-26 VITALS — BP 116/76 | HR 72 | Temp 97.9°F | Ht 64.5 in | Wt 151.1 lb

## 2015-07-26 DIAGNOSIS — K6289 Other specified diseases of anus and rectum: Secondary | ICD-10-CM | POA: Diagnosis not present
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
## 2015-07-26 NOTE — Telephone Encounter (Signed)
Pt has appt with Dr Silvio Pate 07/31/15 at 10:15.

## 2015-07-26 NOTE — Progress Notes (Signed)
Pre visit review using our clinic review tool, if applicable. No additional management support is needed unless otherwise documented below in the visit note. 

## 2015-07-26 NOTE — Telephone Encounter (Signed)
Great, thanks

## 2015-07-26 NOTE — Telephone Encounter (Signed)
See if there is someone who can see her tomorrow if she would rather not wait

## 2015-07-26 NOTE — Telephone Encounter (Signed)
[facility]     Patient Name: CARALEE MOREA Initial Comment Caller states, has irritation in pelvic floor, inflamed and itchy, no discharge   DOB: 1951/10/05      Nurse Assessment  Nurse: Melina Modena, RN, Estill Bamberg Date/Time (Eastern Time): 07/26/2015 11:35:07 AM  Confirm and document reason for call. If symptomatic, describe symptoms. ---Caller states, has irritation in pelvic floor, inflamed and itchy, no discharge; pt has tried anti-itch cream but that did not help; sx started 8-9 days ago  Has the patient traveled out of the country within the last 30 days? ---Not Applicable  Does the patient require triage? ---Yes  Related visit to physician within the last 2 weeks? ---N/A  Does the PT have any chronic conditions? (i.e. diabetes, asthma, etc.) ---No    Guidelines     Guideline Title Affirmed Question Affirmed Notes   Rectal Symptoms MODERATE-SEVERE rectal itching (i.e., interferes with school, work, or sleep)    Final Disposition User   See Physician within 24 Hours Gaines, RN, Estill Bamberg   Comments   Appt scheduled for 07/31/15 at 10:15am with Dr. Silvio Pate. Patient stated she may go to an UC facility before that time if her sx get any worse, but she wanted to go ahead and book an appt just in case   Referrals   [place] UNDECIDED   Disagree/Comply: Comply

## 2015-07-26 NOTE — Telephone Encounter (Signed)
Patient rescheduled appointment from Tuesday with Dr.Letvak to today at 2:45 with Anda Kraft.

## 2015-07-26 NOTE — Patient Instructions (Signed)
Apply a combination of hydrocortisone cream and desitin cream to the rectal area twice daily for itching and irritation.  You may do sitz baths 2-3 times daily for discomfort.   Please notify me if your symptoms do not improve in 2 weeks.  It was a pleasure meeting you!

## 2015-07-26 NOTE — Telephone Encounter (Signed)
PLEASE NOTE: All timestamps contained within this report are represented as [place] Standard Time. CONFIDENTIALTY NOTICE: This fax transmission is intended only for the addressee. It contains information that is legally privileged, confidential or otherwise protected from use or disclosure. If you are not the intended recipient, you are strictly prohibited from reviewing, disclosing, copying using or disseminating any of this information or taking any action in reliance on or regarding this information. If you have received this fax in error, please notify us immediately by telephone so that we can arrange for its return to [place]. Phone: [phone], Toll-Free: [phone], Fax: [phone] Page: 1 of 2 Call Id: [id number] [place] Patient Name: [name] Gender: Female DOB: [birth year] Age: 64 Y 33 M 12 D Return Phone Number: [phone] (Primary), [phone] (Secondary) Address: [place] [address] Client [facility] - Client Client Site [place] - Day Physician Viviana Simpler Contact Type Call Call Type Triage / Clinical Relationship To Patient Self Appointment Disposition EMR Appointment Scheduled Info pasted into Epic Yes Return Phone Number [phone] (Primary) Chief Complaint Itching Initial Comment Caller states, has irritation in pelvic floor, inflamed and itchy, no discharge PreDisposition Call Doctor Nurse Assessment Nurse: Melina Modena, RN, Estill Bamberg Date/Time Eilene Ghazi Time): 07/26/2015 11:35:07 AM Confirm and document reason for call. If symptomatic, describe symptoms. ---Caller states, has irritation in pelvic floor, inflamed and itchy, no discharge; pt has tried anti-itch cream but that did not help; sx started 8-9 days ago Has the patient traveled out of the country within the last 30 days? ---Not Applicable Does  the patient require triage? ---Yes Related visit to physician within the last 2 weeks? ---N/A Does the PT have any chronic conditions? (i.e. diabetes, asthma, etc.) ---No Guidelines Guideline Title Affirmed Question Affirmed Notes Nurse Date/Time (Eastern Time) Rectal Symptoms MODERATE-SEVERE rectal itching (i.e., interferes with school, work, or sleep) Melina Modena, Therapist, sports, Estill Bamberg 07/26/2015 11:38:07 AM Disp. Time Eilene Ghazi Time) Disposition Final User 07/26/2015 11:45:33 AM See Physician within 24 Hours Yes Melina Modena, RN, Shelly Coss Understands: Yes Disagree/Comply: Comply PLEASE NOTE: All timestamps contained within this report are represented as [place] Standard Time. CONFIDENTIALTY NOTICE: This fax transmission is intended only for the addressee. It contains information that is legally privileged, confidential or otherwise protected from use or disclosure. If you are not the intended recipient, you are strictly prohibited from reviewing, disclosing, copying using or disseminating any of this information or taking any action in reliance on or regarding this information. If you have received this fax in error, please notify us immediately by telephone so that we can arrange for its return to [place]. Phone: [phone], Toll-Free: [phone], Fax: [phone] Page: 2 of 2 Call Id: [id number] Care Advice Given Per Guideline SEE PHYSICIAN WITHIN 24 HOURS: * IF OFFICE WILL BE OPEN: You need to be seen within the next 24 hours. Call your doctor when the office opens, and make an appointment. HYDROCORTISONE OINTMENT FOR RECTAL ITCHING: * After SITZ bath and drying your rectal area, apply 1% hydrocortisone ointment (OTC) 2 times a day to reduce irritation. * [place] - Found in various OTC hemorrhoid medications (e.g., Anusol HC, Preparation H Hydrocortisone, Analpram HC Cream) WARM SALINE SITZ BATHS TWICE DAILY FOR RECTAL SYMPTOMS: * Sit in a warm sitz bath for 20 minutes twice a day. This will decrease swelling  and irritation, keep the area clean, and help with healing. CALL BACK IF: * Severe  pain * Fever over 100.5 F (38.1 C) CARE ADVICE given per Rectal Symptoms (Adult) guideline. After Care Instructions Given Call Event Type User Date / Time Description Comments User: Tristan Schroeder, RN Date/Time Eilene Ghazi Time): 07/26/2015 11:47:20 AM Appt scheduled for 07/31/15 at 10:15am with Dr. Silvio Pate. Patient stated she may go to an UC facility before that time if her sx get any worse, but she wanted to go ahead and book an appt just in case Referrals [place] UNDECIDED

## 2015-07-31 ENCOUNTER — Ambulatory Visit: Payer: Self-pay | Admitting: Internal Medicine

## 2015-09-19 ENCOUNTER — Ambulatory Visit (INDEPENDENT_AMBULATORY_CARE_PROVIDER_SITE_OTHER): Payer: Medicare Other

## 2015-09-19 DIAGNOSIS — Z23 Encounter for immunization: Secondary | ICD-10-CM

## 2015-11-12 ENCOUNTER — Ambulatory Visit: Payer: Medicare Other | Attending: Orthopaedic Surgery | Admitting: Occupational Therapy

## 2015-11-12 ENCOUNTER — Encounter: Payer: Self-pay | Admitting: Occupational Therapy

## 2015-11-12 DIAGNOSIS — M25422 Effusion, left elbow: Secondary | ICD-10-CM | POA: Insufficient documentation

## 2015-11-12 DIAGNOSIS — M25522 Pain in left elbow: Secondary | ICD-10-CM

## 2015-11-12 DIAGNOSIS — M7712 Lateral epicondylitis, left elbow: Secondary | ICD-10-CM | POA: Insufficient documentation

## 2015-11-12 DIAGNOSIS — G5632 Lesion of radial nerve, left upper limb: Secondary | ICD-10-CM | POA: Diagnosis present
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
## 2015-11-12 NOTE — Patient Instructions (Signed)
Pt was educated in Home program for extensor stretching & passive left wrist flexion with elbow slightly flexed and then extended x10 reps each, holding x3-5 seconds, self massage to left lateral epicondyle followed by cryotherapy and neutral wrist for functional activities (examples provided and reviewed). Pt was also educated in left tennis elbow brace and she stated that she would purchase one on her own and bring to clinic for assessment of fit/wear recommendations. She verbalized understanding of all of the above.

## 2015-11-15 ENCOUNTER — Encounter: Payer: Self-pay | Admitting: Occupational Therapy

## 2015-11-15 ENCOUNTER — Ambulatory Visit: Payer: Medicare Other | Admitting: Occupational Therapy

## 2015-11-15 DIAGNOSIS — M25522 Pain in left elbow: Secondary | ICD-10-CM | POA: Diagnosis not present

## 2015-11-15 DIAGNOSIS — G5632 Lesion of radial nerve, left upper limb: Secondary | ICD-10-CM

## 2015-11-15 DIAGNOSIS — M7712 Lateral epicondylitis, left elbow: Secondary | ICD-10-CM
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
## 2015-11-17 ENCOUNTER — Other Ambulatory Visit: Payer: Self-pay | Admitting: Internal Medicine

## 2015-11-28 ENCOUNTER — Encounter: Payer: Self-pay | Admitting: Occupational Therapy

## 2015-11-28 ENCOUNTER — Ambulatory Visit: Payer: Medicare Other | Attending: Orthopaedic Surgery | Admitting: Occupational Therapy

## 2015-11-28 DIAGNOSIS — M25422 Effusion, left elbow: Secondary | ICD-10-CM

## 2015-11-28 DIAGNOSIS — G5632 Lesion of radial nerve, left upper limb: Secondary | ICD-10-CM

## 2015-11-28 DIAGNOSIS — M7712 Lateral epicondylitis, left elbow: Secondary | ICD-10-CM

## 2015-11-28 DIAGNOSIS — M25622 Stiffness of left elbow, not elsewhere classified: Secondary | ICD-10-CM | POA: Diagnosis present

## 2015-11-28 DIAGNOSIS — M25522 Pain in left elbow: Secondary | ICD-10-CM

## 2015-11-28 DIAGNOSIS — M79622 Pain in left upper arm: Secondary | ICD-10-CM | POA: Diagnosis present
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
## 2015-11-28 NOTE — Patient Instructions (Signed)
Wear wrist cock-up during day when performing household activities to assist with avoiding grip and wrist extension; Wear armband at other times (splinting use, care and precautions were reviewed with pt in clinic and she verbalized understanding. Pt may also benefit from sleeping with towel roll to assist with preventing elbow & wrist flexion (fetal position). Example and demonstration provided. Pt was min A for placement of armband left elbow today in clinic. Verbal review and performance of home program as previously issued.

## 2015-12-04 ENCOUNTER — Ambulatory Visit: Payer: Medicare Other | Admitting: Occupational Therapy

## 2015-12-04 DIAGNOSIS — M7712 Lateral epicondylitis, left elbow: Secondary | ICD-10-CM | POA: Diagnosis not present
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
## 2015-12-04 NOTE — Patient Instructions (Signed)
Heat and massage over lat epicondyle   Ice at end

## 2015-12-07 ENCOUNTER — Ambulatory Visit: Payer: Medicare Other | Admitting: Occupational Therapy

## 2015-12-07 DIAGNOSIS — M7712 Lateral epicondylitis, left elbow: Secondary | ICD-10-CM

## 2015-12-07 DIAGNOSIS — M25422 Effusion, left elbow: Secondary | ICD-10-CM

## 2015-12-07 DIAGNOSIS — M25522 Pain in left elbow: Secondary | ICD-10-CM

## 2015-12-07 NOTE — Therapy (Signed)
[facility] [address] [place], [address] Phone: [phone]   Fax:  [phone]  Patient Details  Name: [name] MRN: [id number] Date of Birth: 1951-09-01 Referring Provider:  Garald Balding, MD  Encounter Date: 12/07/2015   Rosalyn Gess OTR/l,CLT 12/07/2015, 10:15 AM  [facility] [address], [place], [address] Phone: [phone]   Fax:  [phone]

## 2015-12-07 NOTE — Patient Instructions (Signed)
Same than past - did fit with E tubi grip to wear over elbow at night time - if any swelling at posterior elbow

## 2015-12-11 ENCOUNTER — Ambulatory Visit: Payer: Medicare Other | Admitting: Occupational Therapy

## 2015-12-11 DIAGNOSIS — M7712 Lateral epicondylitis, left elbow: Secondary | ICD-10-CM | POA: Diagnosis not present

## 2015-12-11 DIAGNOSIS — M25422 Effusion, left elbow: Secondary | ICD-10-CM

## 2015-12-11 DIAGNOSIS — M79622 Pain in left upper arm: Secondary | ICD-10-CM

## 2015-12-11 DIAGNOSIS — M25522 Pain in left elbow: Secondary | ICD-10-CM

## 2015-12-11 DIAGNOSIS — M25622 Stiffness of left elbow, not elsewhere classified: Secondary | ICD-10-CM
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
## 2015-12-11 NOTE — Patient Instructions (Signed)
Same as before - wean gradually wrist splint and tubi grip on elbow - to 50% of the time  Lift with palm

## 2015-12-13 ENCOUNTER — Ambulatory Visit: Payer: Medicare Other | Admitting: Occupational Therapy

## 2015-12-13 DIAGNOSIS — M25422 Effusion, left elbow: Secondary | ICD-10-CM

## 2015-12-13 DIAGNOSIS — M25522 Pain in left elbow: Secondary | ICD-10-CM

## 2015-12-13 DIAGNOSIS — M7712 Lateral epicondylitis, left elbow: Secondary | ICD-10-CM | POA: Diagnosis not present

## 2015-12-13 DIAGNOSIS — M25622 Stiffness of left elbow, not elsewhere classified: Secondary | ICD-10-CM
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
## 2015-12-13 NOTE — Patient Instructions (Signed)
Heat to upper arm posterior  Tricep stretch  Massage posterior upper arm  Pt scheduled with massage therapist for Tues

## 2015-12-21 ENCOUNTER — Ambulatory Visit: Payer: Medicare Other | Admitting: Occupational Therapy

## 2015-12-21 DIAGNOSIS — M79622 Pain in left upper arm: Secondary | ICD-10-CM

## 2015-12-21 DIAGNOSIS — M7712 Lateral epicondylitis, left elbow: Secondary | ICD-10-CM | POA: Diagnosis not present

## 2015-12-21 DIAGNOSIS — M25522 Pain in left elbow: Secondary | ICD-10-CM

## 2015-12-21 DIAGNOSIS — M25422 Effusion, left elbow: Secondary | ICD-10-CM
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
## 2015-12-21 NOTE — Patient Instructions (Addendum)
Cont with massage 2 x day   Easy yoga poses

## 2016-01-01 ENCOUNTER — Ambulatory Visit (INDEPENDENT_AMBULATORY_CARE_PROVIDER_SITE_OTHER): Payer: 59 | Admitting: Internal Medicine

## 2016-01-01 ENCOUNTER — Encounter: Payer: Self-pay | Admitting: Internal Medicine

## 2016-01-01 VITALS — BP 116/70 | HR 84 | Temp 98.1°F | Ht 64.75 in | Wt 152.0 lb

## 2016-01-01 DIAGNOSIS — Z0000 Encounter for general adult medical examination without abnormal findings: Secondary | ICD-10-CM

## 2016-01-01 DIAGNOSIS — Z7189 Other specified counseling: Secondary | ICD-10-CM

## 2016-01-01 DIAGNOSIS — E785 Hyperlipidemia, unspecified: Secondary | ICD-10-CM

## 2016-01-01 DIAGNOSIS — S52122S Displaced fracture of head of left radius, sequela: Secondary | ICD-10-CM

## 2016-01-01 DIAGNOSIS — Z23 Encounter for immunization: Secondary | ICD-10-CM | POA: Diagnosis not present

## 2016-01-01 DIAGNOSIS — F39 Unspecified mood [affective] disorder: Secondary | ICD-10-CM

## 2016-01-01 DIAGNOSIS — M8580 Other specified disorders of bone density and structure, unspecified site: Secondary | ICD-10-CM

## 2016-01-01 NOTE — Assessment & Plan Note (Signed)
Mood stable on sertraline Will continue indefinitely at this point

## 2016-01-01 NOTE — Assessment & Plan Note (Signed)
See social history 

## 2016-01-01 NOTE — Addendum Note (Signed)
Addended by: Emelia Salisbury C on: 01/01/2016 12:35 PM   Modules accepted: Orders

## 2016-01-01 NOTE — Assessment & Plan Note (Signed)
Very high but overall low risk She doesn't want meds Will defer labs this year

## 2016-01-01 NOTE — Patient Instructions (Signed)
Please schedule your screening mammogram and your follow up colonoscopy.

## 2016-01-01 NOTE — Progress Notes (Signed)
Pre visit review using our clinic review tool, if applicable. No additional management support is needed unless otherwise documented below in the visit note. 

## 2016-01-01 NOTE — Assessment & Plan Note (Signed)
Calcium, vit D and weight bearing exercise Consider repeat in 5 or more years
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
## 2016-01-01 NOTE — Assessment & Plan Note (Signed)
Unable to extend fully  Still working on PT May try to get plate removed

## 2016-01-01 NOTE — Assessment & Plan Note (Signed)
I have personally reviewed the Medicare Annual Wellness questionnaire and have noted 1. The patient's medical and social history 2. Their use of alcohol, tobacco or illicit drugs 3. Their current medications and supplements 4. The patient's functional ability including ADL's, fall risks, home safety risks and hearing or visual             impairment. 5. Diet and physical activities 6. Evidence for depression or mood disorders  The patients weight, height, BMI and visual acuity have been recorded in the chart I have made referrals, counseling and provided education to the patient based review of the above and I have provided the pt with a written personalized care plan for preventive services.  I have provided you with a copy of your personalized plan for preventive services. Please take the time to review along with your updated medication list.  Due for mammo and colon Pneumovax today Working on fitness

## 2016-01-04 ENCOUNTER — Ambulatory Visit: Payer: Medicare Other | Attending: Orthopaedic Surgery | Admitting: Occupational Therapy

## 2016-01-04 DIAGNOSIS — M25522 Pain in left elbow: Secondary | ICD-10-CM | POA: Diagnosis present

## 2016-01-04 DIAGNOSIS — M25622 Stiffness of left elbow, not elsewhere classified: Secondary | ICD-10-CM
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
## 2016-01-19 ENCOUNTER — Other Ambulatory Visit: Payer: Self-pay | Admitting: Internal Medicine

## 2016-02-25 ENCOUNTER — Other Ambulatory Visit: Payer: Self-pay | Admitting: Internal Medicine

## 2016-02-25 DIAGNOSIS — Z1231 Encounter for screening mammogram for malignant neoplasm of breast: Secondary | ICD-10-CM

## 2016-03-07 ENCOUNTER — Other Ambulatory Visit: Payer: Self-pay | Admitting: Internal Medicine

## 2016-03-07 ENCOUNTER — Ambulatory Visit
Admission: RE | Admit: 2016-03-07 | Discharge: 2016-03-07 | Disposition: A | Discharge status: Home / Self Care | Payer: Medicare Other | Source: Ambulatory Visit | Attending: Internal Medicine | Admitting: Internal Medicine

## 2016-03-07 DIAGNOSIS — Z1231 Encounter for screening mammogram for malignant neoplasm of breast: Secondary | ICD-10-CM

## 2016-04-08 ENCOUNTER — Other Ambulatory Visit: Payer: Self-pay

## 2016-04-08 NOTE — Telephone Encounter (Signed)
Pt left v/m requesting refill triamcinolone 0.025% cream to [facility]. Pt last annual exam on 01/01/16.

## 2016-04-09 MED ORDER — TRIAMCINOLONE ACETONIDE 0.025 % EX CREA: 1.0000 "application " | TOPICAL_CREAM | Freq: Two times a day (BID) | CUTANEOUS | Status: DC | PRN

## 2016-04-09 NOTE — Telephone Encounter (Signed)
Approved: okay #[id number] x 1

## 2016-04-09 NOTE — Telephone Encounter (Signed)
Rx sent electronically.  

## 2016-04-12 ENCOUNTER — Other Ambulatory Visit: Payer: Self-pay | Admitting: Internal Medicine

## 2016-04-24 ENCOUNTER — Telehealth: Payer: Self-pay | Admitting: Internal Medicine

## 2016-04-24 ENCOUNTER — Ambulatory Visit (AMBULATORY_SURGERY_CENTER): Payer: Self-pay | Admitting: *Deleted

## 2016-04-24 ENCOUNTER — Ambulatory Visit (INDEPENDENT_AMBULATORY_CARE_PROVIDER_SITE_OTHER): Payer: Medicare Other | Admitting: Internal Medicine

## 2016-04-24 ENCOUNTER — Encounter: Payer: Self-pay | Admitting: Internal Medicine

## 2016-04-24 VITALS — Ht 65.0 in | Wt 154.0 lb

## 2016-04-24 VITALS — BP 118/66 | HR 127 | Temp 98.4°F | Wt 153.0 lb

## 2016-04-24 DIAGNOSIS — Z800 Family history of malignant neoplasm of digestive organs: Secondary | ICD-10-CM

## 2016-04-24 DIAGNOSIS — I700 Atherosclerosis of aorta: Secondary | ICD-10-CM | POA: Diagnosis not present

## 2016-04-24 DIAGNOSIS — IMO0001 Reserved for inherently not codable concepts without codable children: Secondary | ICD-10-CM

## 2016-04-24 DIAGNOSIS — I350 Nonrheumatic aortic (valve) stenosis: Secondary | ICD-10-CM | POA: Insufficient documentation

## 2016-04-24 MED ORDER — NA SULFATE-K SULFATE-MG SULF 17.5-3.13-1.6 GM/177ML PO SOLN: 1.0000 | Freq: Once | ORAL | Status: DC
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
## 2016-04-24 NOTE — Telephone Encounter (Signed)
Pt has a radial head plate and questioned does she need antibiotis prior to colon like with the dentist. Informed no she does not with a colon Lelan Pons PV

## 2016-04-24 NOTE — Progress Notes (Signed)
No egg or soy allergy known to patient  No issues with past sedation with any surgeries  or procedures, no intubation problems -pt states 15 years ago woke up with colon sedation and post op very N/V  No diet pills per patient No home 02 use per patient  No blood thinners per patient  Pt denies issues with constipation  emmi video declined 2 day prep per recall assessment sheet per pyrtle Pt has a newly dx'd murmur, will see her PCP today, instructed pt if they decide to do further testing call and let us know so we can check with JMP about proceeding or delaying colon

## 2016-04-24 NOTE — Assessment & Plan Note (Signed)
New murmur with no symptoms Doubt significant stenosis--but will check echo

## 2016-04-24 NOTE — Progress Notes (Signed)
Pre visit review using our clinic review tool, if applicable. No additional management support is needed unless otherwise documented below in the visit note. 

## 2016-04-25 ENCOUNTER — Encounter: Payer: Self-pay | Admitting: Internal Medicine

## 2016-05-07 ENCOUNTER — Ambulatory Visit (AMBULATORY_SURGERY_CENTER): Payer: Medicare Other | Admitting: Internal Medicine

## 2016-05-07 ENCOUNTER — Encounter: Payer: Self-pay | Admitting: Internal Medicine

## 2016-05-07 VITALS — BP 111/64 | HR 83 | Temp 97.5°F | Resp 16 | Ht 64.75 in | Wt 153.0 lb

## 2016-05-07 DIAGNOSIS — Z800 Family history of malignant neoplasm of digestive organs: Secondary | ICD-10-CM

## 2016-05-07 DIAGNOSIS — D120 Benign neoplasm of cecum: Secondary | ICD-10-CM

## 2016-05-07 DIAGNOSIS — D129 Benign neoplasm of anus and anal canal: Secondary | ICD-10-CM

## 2016-05-07 DIAGNOSIS — K621 Rectal polyp: Secondary | ICD-10-CM

## 2016-05-07 DIAGNOSIS — D128 Benign neoplasm of rectum: Secondary | ICD-10-CM

## 2016-05-07 DIAGNOSIS — Z86010 Personal history of colonic polyps: Secondary | ICD-10-CM

## 2016-05-07 MED ORDER — SODIUM CHLORIDE 0.9 % IV SOLN: 500.0000 mL | INTRAVENOUS | Status: DC

## 2016-05-07 NOTE — Progress Notes (Signed)
Called to room to assist during endoscopic procedure.  Patient ID and intended procedure confirmed with present staff. Received instructions for my participation in the procedure from the performing physician.  

## 2016-05-07 NOTE — Progress Notes (Signed)
  [facility] Post-op Note  Patient: [name]  Procedure(s) Performed: colonoscopy  Patient Location: [facility] - Recovery Area  Anesthesia Type: Deep Sedation/Propofol  Level of Consciousness: awake, oriented and patient cooperative  Airway and Oxygen Therapy: Patient Spontanous Breathing  Post-op Pain: none  Post-op Assessment:  Post-op Vital signs reviewed, Patient's Cardiovascular Status Stable, Respiratory Function Stable, Patent Airway, No signs of Nausea or vomiting and Pain level controlled  Post-op Vital Signs: Reviewed and stable  Complications: No apparent anesthesia complications  Kymorah Korf E 9:46 AM

## 2016-05-07 NOTE — Patient Instructions (Addendum)
NO ASPIRIN, ASPIRIN PRODUCTS OR NSAIDS (MOTRIN, IBUPROFEN, ADVIL, ALEVE, NAPROSYN ETC.)   YOU HAD AN ENDOSCOPIC PROCEDURE TODAY AT [facility]:   Refer to the procedure report that was given to you for any specific questions about what was found during the examination.  If the procedure report does not answer your questions, please call your gastroenterologist to clarify.  If you requested that your care partner not be given the details of your procedure findings, then the procedure report has been included in a sealed envelope for you to review at your convenience later.  YOU SHOULD EXPECT: Some feelings of bloating in the abdomen. Passage of more gas than usual.  Walking can help get rid of the air that was put into your GI tract during the procedure and reduce the bloating. If you had a lower endoscopy (such as a colonoscopy or flexible sigmoidoscopy) you may notice spotting of blood in your stool or on the toilet paper. If you underwent a bowel prep for your procedure, you may not have a normal bowel movement for a few days.  Please Note:  You might notice some irritation and congestion in your nose or some drainage.  This is from the oxygen used during your procedure.  There is no need for concern and it should clear up in a day or so.  SYMPTOMS TO REPORT IMMEDIATELY:   Following lower endoscopy (colonoscopy or flexible sigmoidoscopy):  Excessive amounts of blood in the stool  Significant tenderness or worsening of abdominal pains  Swelling of the abdomen that is new, acute  Fever of 100F or higher   For urgent or emergent issues, a gastroenterologist can be reached at any hour by calling [phone].   DIET: Your first meal following the procedure should be a small meal and then it is ok to progress to your normal diet. Heavy or fried foods are harder to digest and may make you feel nauseous or bloated.  Likewise, meals heavy in dairy and vegetables can increase  bloating.  Drink plenty of fluids but you should avoid alcoholic beverages for 24 hours.  ACTIVITY:  You should plan to take it easy for the rest of today and you should NOT DRIVE or use heavy machinery until tomorrow (because of the sedation medicines used during the test).    FOLLOW UP: Our staff will call the number listed on your records the next business day following your procedure to check on you and address any questions or concerns that you may have regarding the information given to you following your procedure. If we do not reach you, we will leave a message.  However, if you are feeling well and you are not experiencing any problems, there is no need to return our call.  We will assume that you have returned to your regular daily activities without incident.  If any biopsies were taken you will be contacted by phone or by letter within the next 1-3 weeks.  Please call us at [phone] if you have not heard about the biopsies in 3 weeks.    SIGNATURES/CONFIDENTIALITY: You and/or your care partner have signed paperwork which will be entered into your electronic medical record.  These signatures attest to the fact that that the information above on your After Visit Summary has been reviewed and is understood.  Full responsibility of the confidentiality of this discharge information lies with you and/or your care-partner.

## 2016-05-07 NOTE — Op Note (Signed)
[place] Patient Name: [name] Procedure Date: 05/07/2016 8:58 AM MRN: [id number] Endoscopist: Jerene Bears , MD Age: 65 Referring MD:  Date of Birth: Jan 23, 1951 Gender: Female Procedure:                Colonoscopy Indications:              Screening in patient at increased risk: Family                            history of 1st-degree relative with colorectal                            cancer before age 66 years, Last colonoscopy: 2011 Medicines:                Monitored Anesthesia Care Procedure:                Pre-Anesthesia Assessment:                           - Prior to the procedure, a History and Physical                            was performed, and patient medications and                            allergies were reviewed. The patient's tolerance of                            previous anesthesia was also reviewed. The risks                            and benefits of the procedure and the sedation                            options and risks were discussed with the patient.                            All questions were answered, and informed consent                            was obtained. Prior Anticoagulants: The patient has                            taken no previous anticoagulant or antiplatelet                            agents. ASA Grade Assessment: II - A patient with                            mild systemic disease. After reviewing the risks                            and benefits, the patient was deemed in  satisfactory condition to undergo the procedure.                           After obtaining informed consent, the colonoscope                            was passed under direct vision. Throughout the                            procedure, the patient's blood pressure, pulse, and                            oxygen saturations were monitored continuously. The                            Model PCF-H190L [id number] scope was  introduced                            through the anus and advanced to the the cecum,                            identified by appendiceal orifice and ileocecal                            valve. The colonoscopy was performed without                            difficulty. The patient tolerated the procedure                            well. The quality of the bowel preparation was                            good. The ileocecal valve, appendiceal orifice, and                            rectum were photographed. Scope In: 9:09:22 AM Scope Out: 9:39:49 AM Scope Withdrawal Time: 0 hours 25 minutes 7 seconds  Total Procedure Duration: 0 hours 30 minutes 27 seconds  Findings:                 The digital rectal exam was normal.                           Five sessile polyps were found in the cecum. The                            polyps were 3 to 7 mm in size. These polyps were                            removed with a cold snare. Resection and retrieval                            were complete.  A 20 mm polyp was found in the ileocecal valve. The                            polyp was sessile. Polypectomy was attempted,                            initially using a hot snare. Polyp resection was                            incomplete with this device. This intervention then                            required a different device and polypectomy                            technique. The residual polyp at the edge was                            removed with a cold biopsy forceps. Resection and                            retrieval were complete.                           A 4 mm polyp was found in the rectum. The polyp was                            sessile. The polyp was removed with a cold snare.                            Resection and retrieval were complete.                           Internal hemorrhoids were found during                            retroflexion. The  hemorrhoids were small. Complications:            No immediate complications. Estimated Blood Loss:     Estimated blood loss was minimal. Impression:               - Five 3 to 7 mm polyps in the cecum, removed with                            a cold snare. Resected and retrieved.                           - One 20 mm polyp at the ileocecal valve, removed                            with a hot snare and cold biopsy forceps. Resected  and retrieved.                           - One 4 mm polyp in the rectum, removed with a cold                            snare. Resected and retrieved.                           - Internal hemorrhoids. Recommendation:           - Patient has a contact number available for                            emergencies. The signs and symptoms of potential                            delayed complications were discussed with the                            patient. Return to normal activities tomorrow.                            Written discharge instructions were provided to the                            patient.                           - Resume previous diet.                           - Continue present medications.                           - No ibuprofen, naproxen, or other non-steroidal                            anti-inflammatory drugs for 2 weeks after polyp                            removal.                           - Repeat colonoscopy is recommended for                            surveillance of polyps greater than 1 cm in size.                            The colonoscopy date will be determined after                            pathology results from today's exam become                            available for  review. Jerene Bears, MD 05/07/2016 9:47:06 AM This report has been signed electronically.

## 2016-05-08 ENCOUNTER — Telehealth: Payer: Self-pay | Admitting: *Deleted

## 2016-05-08 NOTE — Telephone Encounter (Signed)
  Follow up Call-  Call back number 05/07/2016  Post procedure Call Back phone  # [phone]  Permission to leave phone message Yes     Patient questions:  Do you have a fever, pain , or abdominal swelling? No. Pain Score  0 *  Have you tolerated food without any problems? Yes.    Have you been able to return to your normal activities? Yes.    Do you have any questions about your discharge instructions: Diet   No. Medications  No. Follow up visit  No.  Do you have questions or concerns about your Care? No.  Actions: * If pain score is 4 or above: No action needed, pain <4.

## 2016-05-09 ENCOUNTER — Telehealth: Payer: Self-pay | Admitting: Internal Medicine

## 2016-05-09 ENCOUNTER — Other Ambulatory Visit: Payer: Self-pay

## 2016-05-09 ENCOUNTER — Ambulatory Visit (INDEPENDENT_AMBULATORY_CARE_PROVIDER_SITE_OTHER): Payer: Medicare Other

## 2016-05-09 DIAGNOSIS — I700 Atherosclerosis of aorta: Secondary | ICD-10-CM | POA: Diagnosis not present

## 2016-05-09 DIAGNOSIS — IMO0001 Reserved for inherently not codable concepts without codable children: Secondary | ICD-10-CM

## 2016-05-09 NOTE — Telephone Encounter (Signed)
Pt called to get her echo results she had done this morning

## 2016-05-10 LAB — ECHOCARDIOGRAM COMPLETE
AOPV: 0.45 m/s
AOVTI: 53.1 cm
AV area mean vel ind: 0.88 cm2/m2
AV peak Index: 0.89
AVA: 1.49 cm2
AVAREAMEANV: 1.54 cm2
AVAREAVTI: 1.57 cm2
AVAREAVTIIND: 0.85 cm2/m2
AVCELMEANRAT: 0.45
AVG: 11 mmHg
AVPG: 24 mmHg
AVPKVEL: 244 cm/s
Ao-asc: 30 cm
CHL CUP AV VEL: 1.49
CHL CUP DOP CALC LVOT VTI: 22.8 cm
DOP CAL AO MEAN VELOCITY: 152 cm/s
FS: 29 % (ref 28–44)
IV/PV OW: 0.95
LA vol: 47 mL
LAVOLA4C: 44.9 mL
LAVOLIN: 26.7 mL/m2
LV PW d: 10.7 mm — AB (ref 0.6–1.1)
LVOT area: 3.46 cm2
LVOT diameter: 21 mm
LVOT peak VTI: 0.43 cm
LVOT peak vel: 111 cm/s
LVOTSV: 79 mL
Valve area index: 0.85

## 2016-05-10 NOTE — Telephone Encounter (Signed)
Sent her note Results not available yet Will release when I get them

## 2016-05-12 ENCOUNTER — Telehealth: Payer: Self-pay | Admitting: *Deleted

## 2016-05-12 NOTE — Telephone Encounter (Signed)
PLEASE NOTE: All timestamps contained within this report are represented as [place] Standard Time. CONFIDENTIALTY NOTICE: This fax transmission is intended only for the addressee. It contains information that is legally privileged, confidential or otherwise protected from use or disclosure. If you are not the intended recipient, you are strictly prohibited from reviewing, disclosing, copying using or disseminating any of this information or taking any action in reliance on or regarding this information. If you have received this fax in error, please notify us immediately by telephone so that we can arrange for its return to [place]. Phone: [phone], Toll-Free: [phone], Fax: [phone] Page: 1 of 1 Call Id: [id number] [place] Night - Client Nonclinical Telephone Record [place] Night - Client Client Site [place] Physician Viviana Simpler - MD Contact Type Call Who Is Calling Patient / Member / Family / Caregiver Caller Name [name] Phone Number [phone] Patient Name Call Type Message Only Information Provided Initial Comment Caller states she had an echocardiogram today and technician said the doctor's office would call with her results today so she wouldn't be waiting over weekend. Additional Comment Office hours provided. Call Closed By: Logan Bores Transaction Date/Time: 05/09/2016 6:41:10 PM (ET)

## 2016-05-12 NOTE — Telephone Encounter (Signed)
Results were there today Released to [facility].

## 2016-05-12 NOTE — Telephone Encounter (Signed)
I only got the results today Was released on mychart Everything looked okay--asked Morey Hummingbird to relay this message to her

## 2016-05-12 NOTE — Telephone Encounter (Signed)
Patient left a voicemail stating that she had an Echo done Friday and was told that the results would be available Friday afternoon. Patient requested a call back today with the results. Patient stated that she has been out of town over the weekend and plans on getting back into town today.

## 2016-05-13 ENCOUNTER — Encounter: Payer: Self-pay | Admitting: Internal Medicine

## 2016-05-29 ENCOUNTER — Ambulatory Visit (INDEPENDENT_AMBULATORY_CARE_PROVIDER_SITE_OTHER): Payer: Medicare Other | Admitting: Family Medicine

## 2016-05-29 ENCOUNTER — Encounter: Payer: Self-pay | Admitting: Family Medicine

## 2016-05-29 VITALS — BP 126/70 | HR 106 | Temp 98.5°F | Wt 153.2 lb

## 2016-05-29 DIAGNOSIS — J209 Acute bronchitis, unspecified: Secondary | ICD-10-CM | POA: Diagnosis not present

## 2016-05-29 MED ORDER — DOXYCYCLINE HYCLATE 100 MG PO TABS: 100.0000 mg | ORAL_TABLET | Freq: Two times a day (BID) | ORAL | Status: DC

## 2016-05-29 NOTE — Progress Notes (Signed)
Pre visit review using our clinic review tool, if applicable. No additional management support is needed unless otherwise documented below in the visit note.  duration of symptoms: about 1 week ago Rhinorrhea: yes Congestion: mild ear pain: mild ear pain sore throat:yes Cough: yes, minimal sputum but yellow and thick  Myalgias: no other concerns: fatigue noted.  occ chest tightness that clears with a cough.   Per HPI unless specifically indicated in ROS section   Meds, vitals, and allergies reviewed.   GEN: nad, alert and oriented HEENT: mucous membranes moist, TM w/o erythema, nasal epithelium injected with purulent discharge, OP with cobblestoning NECK: supple w/small tender LA on L side CV: rrr. PULM: ctab, no inc wob ABD: soft, +bs EXT: no edema

## 2016-05-29 NOTE — Patient Instructions (Signed)
Likely bronchitis.   Doxy (sun caution), rest and fluids, OTC cold medicine.   Take care.  Glad to see you.  Update [place] as needed.

## 2016-05-29 NOTE — Assessment & Plan Note (Signed)
Sx for 1 week.  No sign of PNA on exam.  Likely bronchitis.   Doxy (sun caution), rest and fluids, OTC cold medicine.   Would treat given duration and ongoing purulent sputum.   Okay for outpatient f/u.   She agrees with plan.

## 2016-07-18 IMAGING — MG MM DIGITAL SCREENING BILAT W/ TOMO W/ CAD
8 of 13 series · 8 of 29 positions shown · non-contrast
Comparison: Previous exam(s).

CLINICAL DATA: Screening.

EXAM:
2D DIGITAL SCREENING BILATERAL MAMMOGRAM WITH CAD AND ADJUNCT TOMO

[L MLO (1 of 2)]
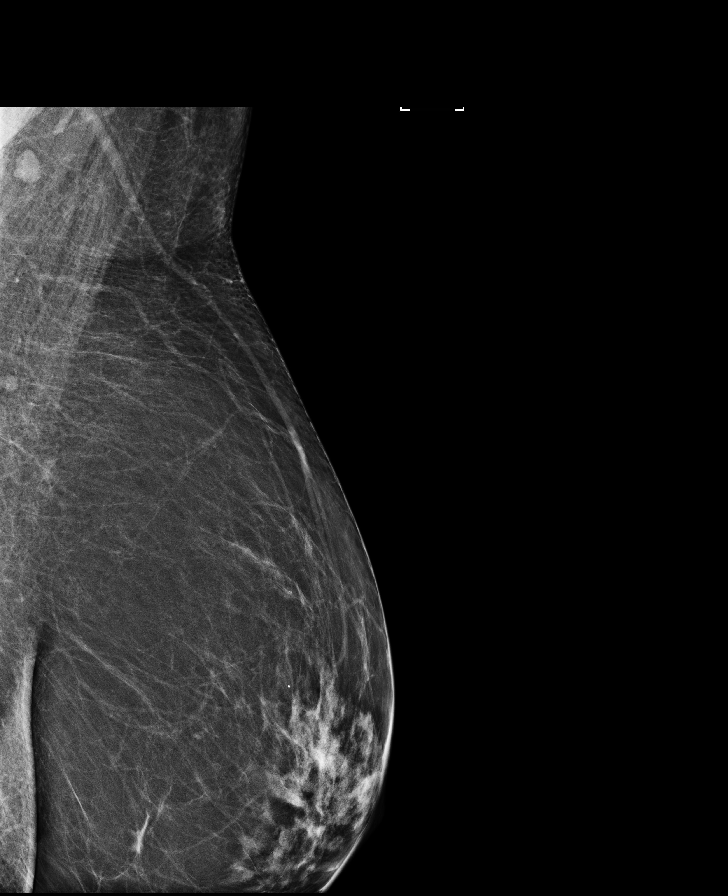

[R CC synth-2D]
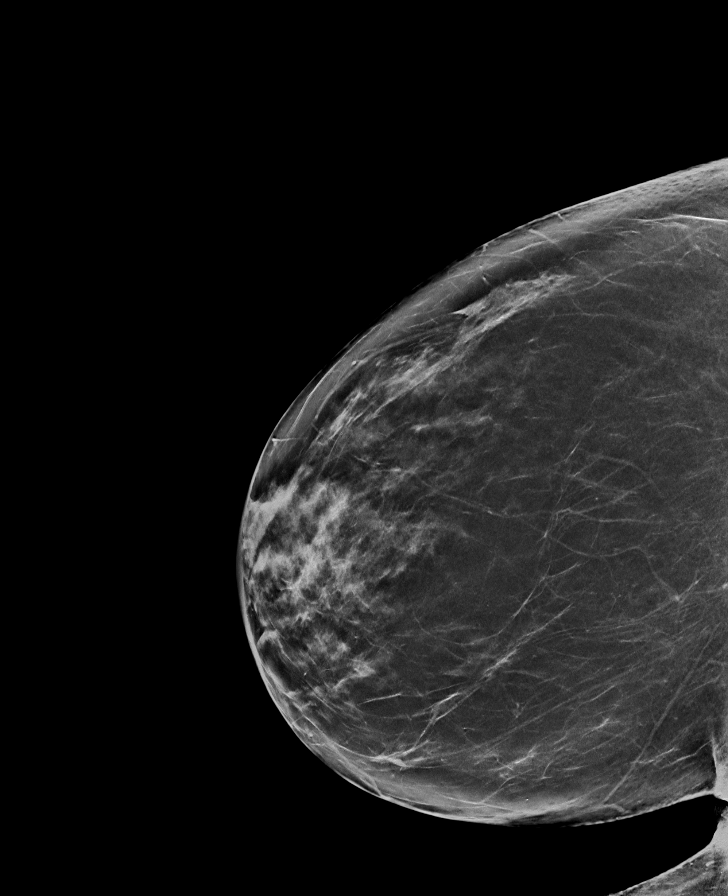

[R CC]
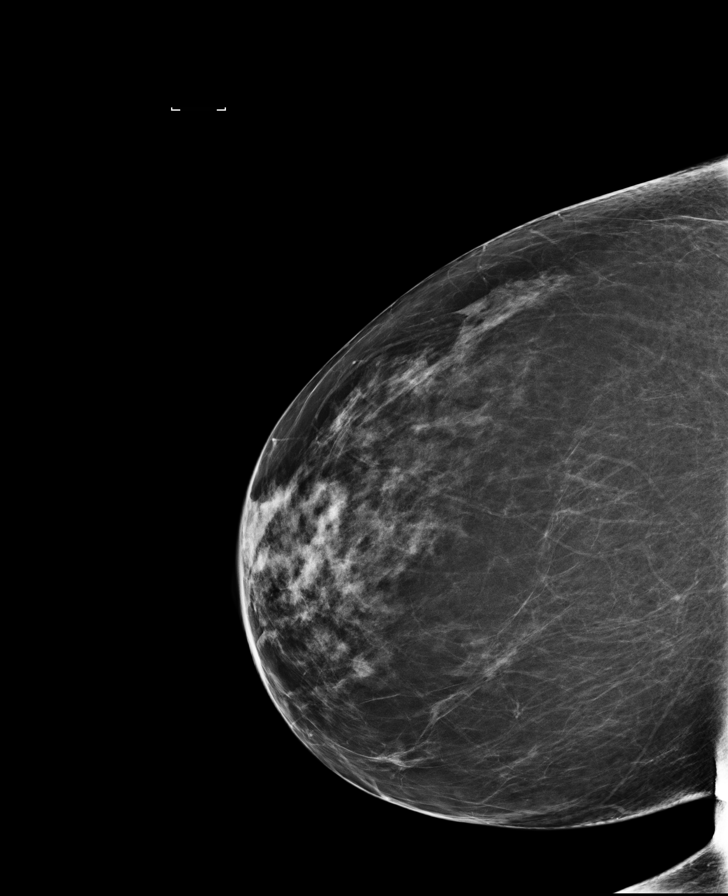

[R MLO synth-2D]
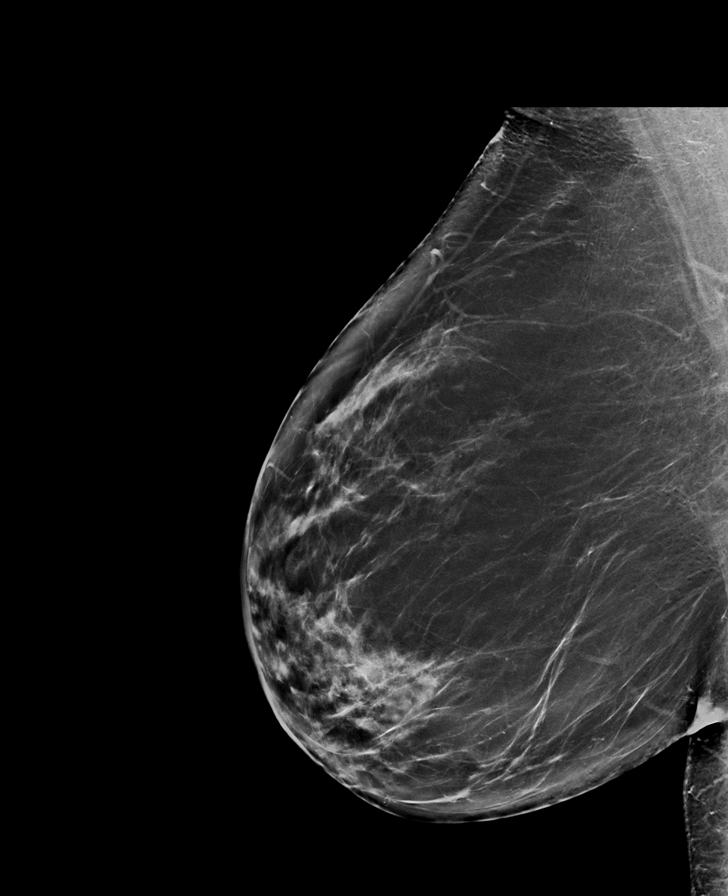

[L MLO (2 of 2)]
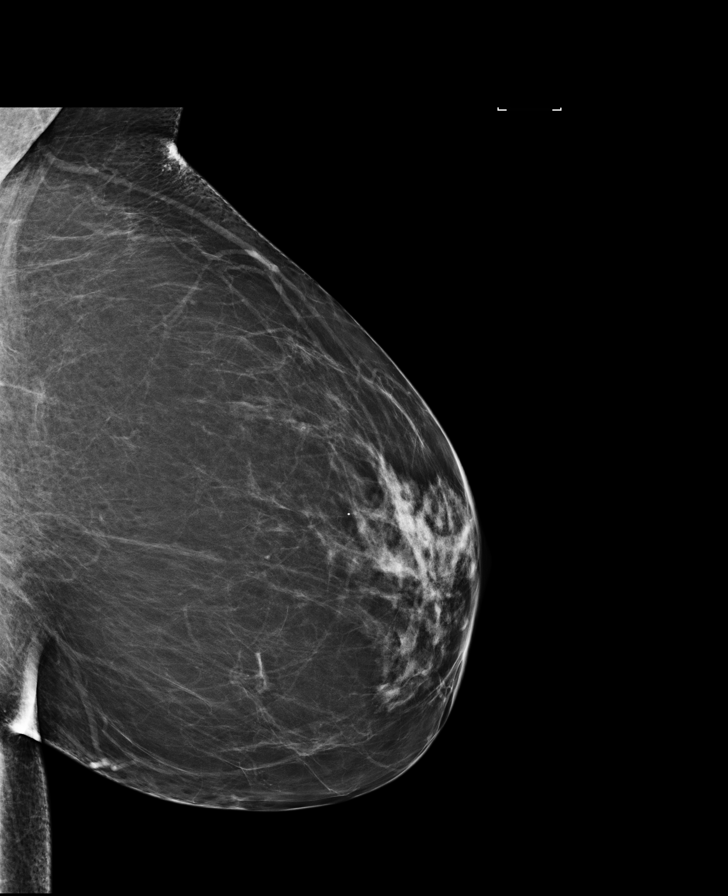

[L CC synth-2D]
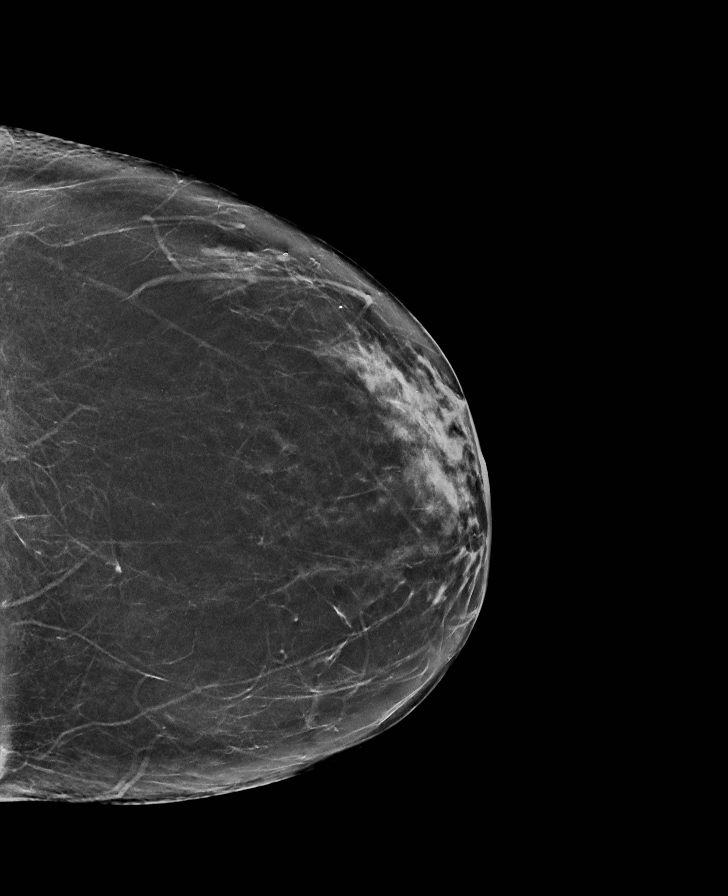

[L MLO synth-2D]
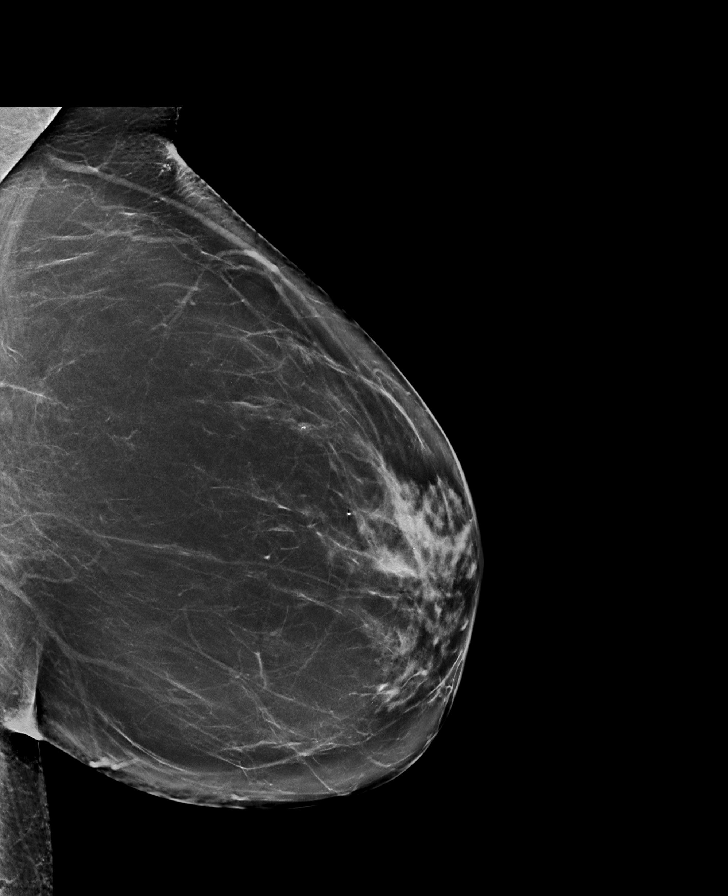

[R MLO]
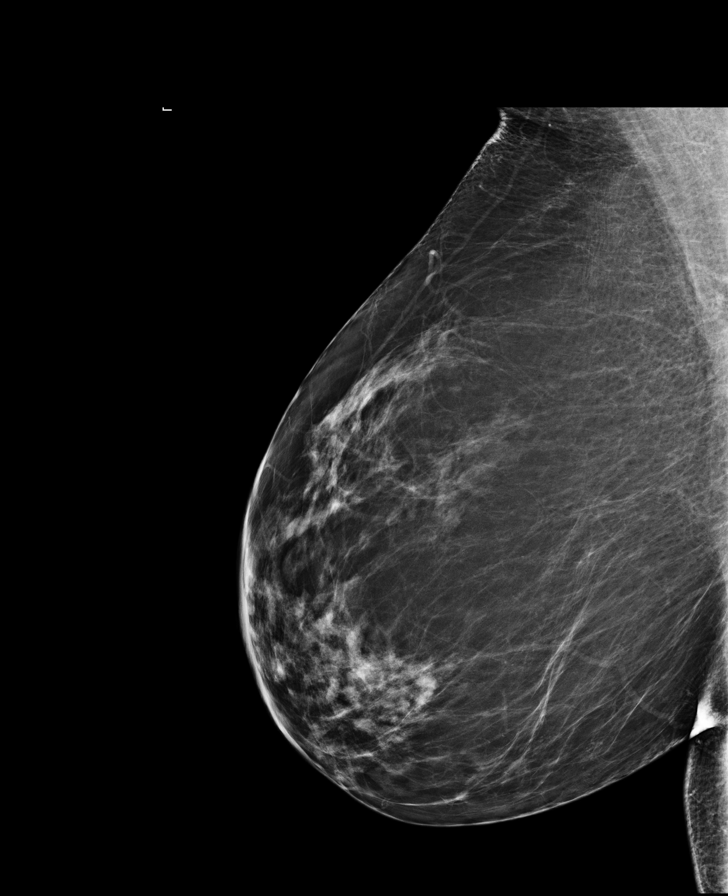

[8 of 29 positions shown; findings below may reference images not displayed]

ACR Breast Density Category c: The breast tissue is heterogeneously
dense, which may obscure small masses.
FINDINGS: There are no findings suspicious for malignancy. Images were
processed with CAD.
IMPRESSION: No mammographic evidence of malignancy. A result letter of this
screening mammogram will be mailed directly to the patient.

RECOMMENDATION:
Screening mammogram in one year. (Code:[id number])

BI-RADS CATEGORY  1: Negative.

## 2016-08-21 ENCOUNTER — Encounter: Payer: Self-pay | Admitting: *Deleted

## 2016-09-02 ENCOUNTER — Ambulatory Visit (INDEPENDENT_AMBULATORY_CARE_PROVIDER_SITE_OTHER): Payer: Medicare Other

## 2016-09-02 DIAGNOSIS — Z23 Encounter for immunization: Secondary | ICD-10-CM | POA: Diagnosis not present

## 2016-09-03 ENCOUNTER — Encounter: Payer: Self-pay | Admitting: Internal Medicine

## 2016-09-24 ENCOUNTER — Encounter: Payer: Self-pay | Admitting: Family Medicine

## 2016-09-24 ENCOUNTER — Ambulatory Visit (INDEPENDENT_AMBULATORY_CARE_PROVIDER_SITE_OTHER): Payer: Medicare Other | Admitting: Family Medicine

## 2016-09-24 VITALS — BP 102/62 | HR 99 | Temp 99.3°F | Wt 154.0 lb

## 2016-09-24 DIAGNOSIS — J069 Acute upper respiratory infection, unspecified: Secondary | ICD-10-CM

## 2016-09-24 MED ORDER — HYDROCODONE-HOMATROPINE 5-1.5 MG/5ML PO SYRP: 5.0000 mL | ORAL_SOLUTION | Freq: Every evening | ORAL | 0 refills | Status: DC | PRN

## 2016-09-24 MED ORDER — AZITHROMYCIN 250 MG PO TABS: ORAL_TABLET | ORAL | 0 refills | Status: DC

## 2016-09-24 NOTE — Patient Instructions (Signed)
Upper Respiratory Infection, Adult Most upper respiratory infections (URIs) are a viral infection of the air passages leading to the lungs. A URI affects the nose, throat, and upper air passages. The most common type of URI is nasopharyngitis and is typically referred to as "the common cold." URIs run their course and usually go away on their own. Most of the time, a URI does not require medical attention, but sometimes a bacterial infection in the upper airways can follow a viral infection. This is called a secondary infection. Sinus and middle ear infections are common types of secondary upper respiratory infections. Bacterial pneumonia can also complicate a URI. A URI can worsen asthma and chronic obstructive pulmonary disease (COPD). Sometimes, these complications can require emergency medical care and may be life threatening.  CAUSES Almost all URIs are caused by viruses. A virus is a type of germ and can spread from one person to another.  RISKS FACTORS You may be at risk for a URI if:   You smoke.   You have chronic heart or lung disease.  You have a weakened defense (immune) system.   You are very young or very old.   You have nasal allergies or asthma.  You work in crowded or poorly ventilated areas.  You work in health care facilities or schools. SIGNS AND SYMPTOMS  Symptoms typically develop 2-3 days after you come in contact with a cold virus. Most viral URIs last 7-10 days. However, viral URIs from the influenza virus (flu virus) can last 14-18 days and are typically more severe. Symptoms may include:   Runny or stuffy (congested) nose.   Sneezing.   Cough.   Sore throat.   Headache.   Fatigue.   Fever.   Loss of appetite.   Pain in your forehead, behind your eyes, and over your cheekbones (sinus pain).  Muscle aches.  DIAGNOSIS  Your health care provider may diagnose a URI by:  Physical exam.  Tests to check that your symptoms are not due to  another condition such as:  Strep throat.  Sinusitis.  Pneumonia.  Asthma. TREATMENT  A URI goes away on its own with time. It cannot be cured with medicines, but medicines may be prescribed or recommended to relieve symptoms. Medicines may help:  Reduce your fever.  Reduce your cough.  Relieve nasal congestion. HOME CARE INSTRUCTIONS   Take medicines only as directed by your health care provider.   Gargle warm saltwater or take cough drops to comfort your throat as directed by your health care provider.  Use a warm mist humidifier or inhale steam from a shower to increase air moisture. This may make it easier to breathe.  Drink enough fluid to keep your urine clear or pale yellow.   Eat soups and other clear broths and maintain good nutrition.   Rest as needed.   Return to work when your temperature has returned to normal or as your health care provider advises. You may need to stay home longer to avoid infecting others. You can also use a face mask and careful hand washing to prevent spread of the virus.  Increase the usage of your inhaler if you have asthma.   Do not use any tobacco products, including cigarettes, chewing tobacco, or electronic cigarettes. If you need help quitting, ask your health care provider. PREVENTION  The best way to protect yourself from getting a cold is to practice good hygiene.   Avoid oral or hand contact with people with cold   symptoms.   Wash your hands often if contact occurs.  There is no clear evidence that vitamin C, vitamin E, echinacea, or exercise reduces the chance of developing a cold. However, it is always recommended to get plenty of rest, exercise, and practice good nutrition.  SEEK MEDICAL CARE IF:   You are getting worse rather than better.   Your symptoms are not controlled by medicine.   You have chills.  You have worsening shortness of breath.  You have brown or red mucus.  You have yellow or brown nasal  discharge.  You have pain in your face, especially when you bend forward.  You have a fever.  You have swollen neck glands.  You have pain while swallowing.  You have white areas in the back of your throat. SEEK IMMEDIATE MEDICAL CARE IF:   You have severe or persistent:  Headache.  Ear pain.  Sinus pain.  Chest pain.  You have chronic lung disease and any of the following:  Wheezing.  Prolonged cough.  Coughing up blood.  A change in your usual mucus.  You have a stiff neck.  You have changes in your:  Vision.  Hearing.  Thinking.  Mood. MAKE SURE YOU:   Understand these instructions.  Will watch your condition.  Will get help right away if you are not doing well or get worse.   This information is not intended to replace advice given to you by your health care provider. Make sure you discuss any questions you have with your health care provider.   Document Released: 05/06/2001 Document Revised: 03/27/2015 Document Reviewed: 02/15/2014 Elsevier Interactive Patient Education 2016 [employer] Inc[employer]  
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
## 2016-11-25 ENCOUNTER — Encounter: Payer: Self-pay | Admitting: Internal Medicine

## 2017-01-02 ENCOUNTER — Ambulatory Visit (INDEPENDENT_AMBULATORY_CARE_PROVIDER_SITE_OTHER): Payer: Medicare Other

## 2017-01-02 VITALS — BP 110/74 | HR 75 | Temp 98.2°F | Ht 64.5 in | Wt 151.5 lb

## 2017-01-02 DIAGNOSIS — Z0000 Encounter for general adult medical examination without abnormal findings: Secondary | ICD-10-CM | POA: Diagnosis not present

## 2017-01-02 NOTE — Progress Notes (Signed)
PCP notes:   Health maintenance:  Hep C screening - addressed; postponed until future labs  Abnormal screenings:   None  Patient concerns:   None  Nurse concerns:  None  Next PCP appt:   01/19/17 @ 1200
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
## 2017-01-02 NOTE — Progress Notes (Signed)
Pre visit review using our clinic review tool, if applicable. No additional management support is needed unless otherwise documented below in the visit note. 

## 2017-01-02 NOTE — Patient Instructions (Signed)
[name] , Thank you for taking time to come for your Medicare Wellness Visit. I appreciate your ongoing commitment to your health goals. Please review the following plan we discussed and let me know if I can assist you in the future.   These are the goals we discussed: Goals    . Increase physical activity          Starting 01/02/2017, I will continue to walk at least 25 min 2 days per week and to do water aerobics at least 60 min 3 days per week.        This is a list of the screening recommended for you and due dates:  Health Maintenance  Topic Date Due  .  Hepatitis C: One time screening is recommended by Center for Disease Control  (CDC) for  adults born from [birth year] through 1966.   01/02/2018*  . OT PLAN OF CARE  01/02/2026*  . Colon Cancer Screening  05/07/2017  . Mammogram  03/07/2018  . Tetanus Vaccine  09/22/2019  . Flu Shot  Completed  . DEXA scan (bone density measurement)  Completed  . Shingles Vaccine  Completed  . Pneumonia vaccines  Completed  *Topic was postponed. The date shown is not the original due date.   Preventive Care for Adults  A healthy lifestyle and preventive care can promote health and wellness. Preventive health guidelines for adults include the following key practices.  . A routine yearly physical is a good way to check with your health care provider about your health and preventive screening. It is a chance to share any concerns and updates on your health and to receive a thorough exam.  . Visit your dentist for a routine exam and preventive care every 6 months. Brush your teeth twice a day and floss once a day. Good oral hygiene prevents tooth decay and gum disease.  . The frequency of eye exams is based on your age, health, family medical history, use  of contact lenses, and other factors. Follow your health care provider's ecommendations for frequency of eye exams.  . Eat a healthy diet. Foods like vegetables, fruits, whole grains, low-fat dairy  products, and lean protein foods contain the nutrients you need without too many calories. Decrease your intake of foods high in solid fats, added sugars, and salt. Eat the right amount of calories for you. Get information about a proper diet from your health care provider, if necessary.  . Regular physical exercise is one of the most important things you can do for your health. Most adults should get at least 150 minutes of moderate-intensity exercise (any activity that increases your heart rate and causes you to sweat) each week. In addition, most adults need muscle-strengthening exercises on 2 or more days a week.  Silver Sneakers may be a benefit available to you. To determine eligibility, you may visit the website: www.silversneakers.com or contact program at 7098234567 Mon-Fri between 8AM-8PM.   . Maintain a healthy weight. The body mass index (BMI) is a screening tool to identify possible weight problems. It provides an estimate of body fat based on height and weight. Your health care provider can find your BMI and can help you achieve or maintain a healthy weight.   For adults 20 years and older: ? A BMI below 18.5 is considered underweight. ? A BMI of 18.5 to 24.9 is normal. ? A BMI of 25 to 29.9 is considered overweight. ? A BMI of 30 and above is considered obese.   Marland Kitchen  Maintain normal blood lipids and cholesterol levels by exercising and minimizing your intake of saturated fat. Eat a balanced diet with plenty of fruit and vegetables. Blood tests for lipids and cholesterol should begin at age 80 and be repeated every 5 years. If your lipid or cholesterol levels are high, you are over 50, or you are at high risk for heart disease, you may need your cholesterol levels checked more frequently. Ongoing high lipid and cholesterol levels should be treated with medicines if diet and exercise are not working.  . If you smoke, find out from your health care provider how to quit. If you do not  use tobacco, please do not start.  . If you choose to drink alcohol, please do not consume more than 2 drinks per day. One drink is considered to be 12 ounces (355 mL) of beer, 5 ounces (148 mL) of wine, or 1.5 ounces (44 mL) of liquor.  . If you are 71-48 years old, ask your health care provider if you should take aspirin to prevent strokes.  . Use sunscreen. Apply sunscreen liberally and repeatedly throughout the day. You should seek shade when your shadow is shorter than you. Protect yourself by wearing long sleeves, pants, a wide-brimmed hat, and sunglasses year round, whenever you are outdoors.  . Once a month, do a whole body skin exam, using a mirror to look at the skin on your back. Tell your health care provider of new moles, moles that have irregular borders, moles that are larger than a pencil eraser, or moles that have changed in shape or color.

## 2017-01-03 NOTE — Progress Notes (Signed)
I reviewed health advisor's note, was available for consultation, and agree with documentation and plan.  

## 2017-01-14 ENCOUNTER — Ambulatory Visit (AMBULATORY_SURGERY_CENTER): Payer: Self-pay

## 2017-01-14 ENCOUNTER — Encounter: Payer: Self-pay | Admitting: Internal Medicine

## 2017-01-14 VITALS — Ht 64.5 in | Wt 153.8 lb

## 2017-01-14 DIAGNOSIS — Z86010 Personal history of colonic polyps: Secondary | ICD-10-CM

## 2017-01-14 MED ORDER — NA SULFATE-K SULFATE-MG SULF 17.5-3.13-1.6 GM/177ML PO SOLN: 1.0000 | Freq: Once | ORAL | 0 refills | Status: AC

## 2017-01-14 NOTE — Progress Notes (Signed)
Patient denies allergies to eggs or soy. Patient is not taking diet pills. Patient is not on home 02. Patient has not had a problem with sedation.   Patient declines Emmi.

## 2017-01-19 ENCOUNTER — Ambulatory Visit (INDEPENDENT_AMBULATORY_CARE_PROVIDER_SITE_OTHER): Payer: Medicare Other | Admitting: Internal Medicine

## 2017-01-19 ENCOUNTER — Encounter: Payer: Self-pay | Admitting: Internal Medicine

## 2017-01-19 VITALS — BP 110/70 | HR 83 | Temp 98.8°F | Wt 151.0 lb

## 2017-01-19 DIAGNOSIS — F39 Unspecified mood [affective] disorder: Secondary | ICD-10-CM | POA: Diagnosis not present

## 2017-01-19 DIAGNOSIS — M8580 Other specified disorders of bone density and structure, unspecified site: Secondary | ICD-10-CM | POA: Diagnosis not present

## 2017-01-19 DIAGNOSIS — E785 Hyperlipidemia, unspecified: Secondary | ICD-10-CM | POA: Diagnosis not present

## 2017-01-19 DIAGNOSIS — I350 Nonrheumatic aortic (valve) stenosis: Secondary | ICD-10-CM

## 2017-01-19 LAB — LIPID PANEL
CHOL/HDL RATIO: 6
Cholesterol: 274 mg/dL — ABNORMAL HIGH (ref 0–200)
HDL: 46.8 mg/dL (ref >39.00)
LDL Cholesterol: 198 mg/dL — ABNORMAL HIGH (ref 0–99)
NonHDL: 227.27
Triglycerides: 147 mg/dL (ref 0.0–149.0)
VLDL: 29.4 mg/dL (ref 0.0–40.0)

## 2017-01-19 LAB — T4, FREE: FREE T4: 0.81 ng/dL (ref 0.60–1.60)

## 2017-01-19 LAB — CBC WITH DIFFERENTIAL/PLATELET
BASOS ABS: 0.1 10*3/uL (ref 0.0–0.1)
Basophils Relative: 0.8 % (ref 0.0–3.0)
Eosinophils Absolute: 0.1 10*3/uL (ref 0.0–0.7)
Eosinophils Relative: 2 % (ref 0.0–5.0)
HCT: 38.6 % (ref 36.0–46.0)
Hemoglobin: 12.7 g/dL (ref 12.0–15.0)
LYMPHS ABS: 2.1 10*3/uL (ref 0.7–4.0)
Lymphocytes Relative: 32.9 % (ref 12.0–46.0)
MCHC: 32.9 g/dL (ref 30.0–36.0)
MCV: 94.2 fl (ref 78.0–100.0)
MONOS PCT: 9.7 % (ref 3.0–12.0)
Monocytes Absolute: 0.6 10*3/uL (ref 0.1–1.0)
NEUTROS PCT: 54.6 % (ref 43.0–77.0)
Neutro Abs: 3.5 10*3/uL (ref 1.4–7.7)
Platelets: 299 10*3/uL (ref 150.0–400.0)
RBC: 4.09 Mil/uL (ref 3.87–5.11)
RDW: 13.6 % (ref 11.5–15.5)
WBC: 6.5 10*3/uL (ref 4.0–10.5)

## 2017-01-19 LAB — COMPREHENSIVE METABOLIC PANEL
ALBUMIN: 4 g/dL (ref 3.5–5.2)
ALK PHOS: 58 U/L (ref 39–117)
ALT: 17 U/L (ref 0–35)
AST: 18 U/L (ref 0–37)
BILIRUBIN TOTAL: 0.3 mg/dL (ref 0.2–1.2)
BUN: 17 mg/dL (ref 6–23)
CALCIUM: 10.5 mg/dL (ref 8.4–10.5)
CO2: 31 mEq/L (ref 19–32)
Chloride: 102 mEq/L (ref 96–112)
Creatinine, Ser: 0.65 mg/dL (ref 0.40–1.20)
GFR: 96.63 mL/min (ref >60.00)
GLUCOSE: 91 mg/dL (ref 70–99)
Potassium: 4.9 mEq/L (ref 3.5–5.1)
Sodium: 139 mEq/L (ref 135–145)
TOTAL PROTEIN: 7.1 g/dL (ref 6.0–8.3)

## 2017-01-19 NOTE — Assessment & Plan Note (Signed)
Remains on calcium D Regular weight bearing exercise Would not rush to recheck since diagnosed at age 66 and didn't appear to progress over 15 years

## 2017-01-19 NOTE — Assessment & Plan Note (Signed)
No FH of early coronary disease or stroke She prefers no Rx--but will recheck Discussed lifestyle

## 2017-01-19 NOTE — Assessment & Plan Note (Signed)
Chronic dysthymia and anxiety Controlled with low dose sertraline Wean not indicated

## 2017-01-19 NOTE — Assessment & Plan Note (Signed)
Minimal findings on echo No symptoms No action needed

## 2017-01-19 NOTE — Progress Notes (Signed)
Pre visit review using our clinic review tool, if applicable. No additional management support is needed unless otherwise documented below in the visit note. 
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
## 2017-01-20 ENCOUNTER — Other Ambulatory Visit: Payer: Self-pay | Admitting: Internal Medicine

## 2017-01-27 ENCOUNTER — Ambulatory Visit (AMBULATORY_SURGERY_CENTER): Payer: Medicare Other | Admitting: Internal Medicine

## 2017-01-27 ENCOUNTER — Encounter: Payer: Medicare Other | Admitting: Internal Medicine

## 2017-01-27 ENCOUNTER — Encounter: Payer: Self-pay | Admitting: Internal Medicine

## 2017-01-27 VITALS — BP 100/76 | HR 71 | Temp 98.0°F | Resp 17 | Ht 64.0 in | Wt 153.0 lb

## 2017-01-27 DIAGNOSIS — Z86010 Personal history of colonic polyps: Secondary | ICD-10-CM | POA: Diagnosis present

## 2017-01-27 DIAGNOSIS — D120 Benign neoplasm of cecum: Secondary | ICD-10-CM

## 2017-01-27 DIAGNOSIS — K635 Polyp of colon: Secondary | ICD-10-CM | POA: Diagnosis not present

## 2017-01-27 HISTORY — PX: COLONOSCOPY: SHX174

## 2017-01-27 MED ORDER — SODIUM CHLORIDE 0.9 % IV SOLN: 500.0000 mL | INTRAVENOUS | Status: DC

## 2017-01-27 NOTE — Patient Instructions (Signed)
YOU HAD AN ENDOSCOPIC PROCEDURE TODAY AT [facility]:   Refer to the procedure report that was given to you for any specific questions about what was found during the examination.  If the procedure report does not answer your questions, please call your gastroenterologist to clarify.  If you requested that your care partner not be given the details of your procedure findings, then the procedure report has been included in a sealed envelope for you to review at your convenience later.  YOU SHOULD EXPECT: Some feelings of bloating in the abdomen. Passage of more gas than usual.  Walking can help get rid of the air that was put into your GI tract during the procedure and reduce the bloating. If you had a lower endoscopy (such as a colonoscopy or flexible sigmoidoscopy) you may notice spotting of blood in your stool or on the toilet paper. If you underwent a bowel prep for your procedure, you may not have a normal bowel movement for a few days.  Please Note:  You might notice some irritation and congestion in your nose or some drainage.  This is from the oxygen used during your procedure.  There is no need for concern and it should clear up in a day or so.  SYMPTOMS TO REPORT IMMEDIATELY:   Following lower endoscopy (colonoscopy or flexible sigmoidoscopy):  Excessive amounts of blood in the stool  Significant tenderness or worsening of abdominal pains  Swelling of the abdomen that is new, acute  Fever of 100F or higher  For urgent or emergent issues, a gastroenterologist can be reached at any hour by calling [phone].   DIET:  We do recommend a small meal at first, but then you may proceed to your regular diet.  Drink plenty of fluids but you should avoid alcoholic beverages for 24 hours.  ACTIVITY:  You should plan to take it easy for the rest of today and you should NOT DRIVE or use heavy machinery until tomorrow (because of the sedation medicines used during the test).     FOLLOW UP: Our staff will call the number listed on your records the next business day following your procedure to check on you and address any questions or concerns that you may have regarding the information given to you following your procedure. If we do not reach you, we will leave a message.  However, if you are feeling well and you are not experiencing any problems, there is no need to return our call.  We will assume that you have returned to your regular daily activities without incident.  If any biopsies were taken you will be contacted by phone or by letter within the next 1-3 weeks.  Please call us at [phone] if you have not heard about the biopsies in 3 weeks.    SIGNATURES/CONFIDENTIALITY: You and/or your care partner have signed paperwork which will be entered into your electronic medical record.  These signatures attest to the fact that that the information above on your After Visit Summary has been reviewed and is understood.  Full responsibility of the confidentiality of this discharge information lies with you and/or your care-partner.  Polyp and hemorrhoid information given.  Dr. Hilarie Fredrickson will let you know about recall in letter after pathology report is reviewed.

## 2017-01-27 NOTE — Op Note (Signed)
[facility] Patient Name: [name] Procedure Date: 01/27/2017 8:09 AM MRN: [id number] Endoscopist: Jerene Bears , MD Age: 66 Referring MD:  Date of Birth: 14-Feb-1951 Gender: Female Account #: [id number] Procedure:                Colonoscopy Indications:              Surveillance: Personal history of piecemeal removal                            of adenoma on last colonoscopy (less than 1 year                            ago) Medicines:                Monitored Anesthesia Care Procedure:                Pre-Anesthesia Assessment:                           - Prior to the procedure, a History and Physical                            was performed, and patient medications and                            allergies were reviewed. The patient's tolerance of                            previous anesthesia was also reviewed. The risks                            and benefits of the procedure and the sedation                            options and risks were discussed with the patient.                            All questions were answered, and informed consent                            was obtained. Prior Anticoagulants: The patient has                            taken no previous anticoagulant or antiplatelet                            agents. ASA Grade Assessment: II - A patient with                            mild systemic disease. After reviewing the risks                            and benefits, the patient was deemed in  satisfactory condition to undergo the procedure.                           After obtaining informed consent, the colonoscope                            was passed under direct vision. Throughout the                            procedure, the patient's blood pressure, pulse, and                            oxygen saturations were monitored continuously. The                            Colonoscope was introduced through the anus and                             advanced to the the terminal ileum. The colonoscopy                            was performed without difficulty. The patient                            tolerated the procedure well. The quality of the                            bowel preparation was good. The terminal ileum,                            ileocecal valve, appendiceal orifice, and rectum                            were photographed. Scope In: 8:12:21 AM Scope Out: 8:31:02 AM Scope Withdrawal Time: 0 hours 13 minutes 28 seconds  Total Procedure Duration: 0 hours 18 minutes 41 seconds  Findings:                 The digital rectal exam was normal.                           The terminal ileum appeared normal.                           A 2 mm polyp was found in the cecum. The polyp was                            sessile. The polyp was removed with a cold biopsy                            forceps. Resection and retrieval were complete.                           A medium-sized post polypectomy scar was found at  the ileocecal valve. There was no evidence of the                            previous polyp.                           Internal hemorrhoids were found during                            retroflexion. The hemorrhoids were small.                           The exam was otherwise without abnormality. Complications:            No immediate complications. Estimated Blood Loss:     Estimated blood loss was minimal. Impression:               - The examined portion of the ileum was normal.                           - One 2 mm polyp in the cecum, removed with a cold                            biopsy forceps. Resected and retrieved.                           - Post-polypectomy scar at the ileocecal valve                            without evidence of residual polyp.                           - Small internal hemorrhoids.                           - The examination was otherwise  normal. Recommendation:           - Patient has a contact number available for                            emergencies. The signs and symptoms of potential                            delayed complications were discussed with the                            patient. Return to normal activities tomorrow.                            Written discharge instructions were provided to the                            patient.                           - Resume previous diet.                           -  Continue present medications.                           - Await pathology results.                           - Repeat colonoscopy is recommended for                            surveillance. The colonoscopy date will be                            determined after pathology results from today's                            exam become available for review (likely in 3                            years). Jerene Bears, MD 01/27/2017 8:35:58 AM This report has been signed electronically.

## 2017-01-27 NOTE — Progress Notes (Signed)
Report to PACU, RN, vss, BBS= Clear.  

## 2017-01-27 NOTE — Progress Notes (Signed)
Pt's states no medical or surgical changes since previsit or office visit. 

## 2017-01-27 NOTE — Progress Notes (Signed)
Called to room to assist during endoscopic procedure.  Patient ID and intended procedure confirmed with present staff. Received instructions for my participation in the procedure from the performing physician.  

## 2017-01-28 ENCOUNTER — Telehealth: Payer: Self-pay | Admitting: *Deleted

## 2017-01-28 NOTE — Telephone Encounter (Signed)
  Follow up Call-  Call back number 01/27/2017 05/07/2016  Post procedure Call Back phone  # [phone]  Permission to leave phone message Yes Yes  Some recent data might be hidden     Patient questions:  Do you have a fever, pain , or abdominal swelling? Yes.   Pain Score  0 *  Have you tolerated food without any problems? Yes.    Have you been able to return to your normal activities? Yes.    Do you have any questions about your discharge instructions: Diet   No. Medications  No. Follow up visit  No.  Do you have questions or concerns about your Care? No.  Actions: * If pain score is 4 or above: No action needed, pain <4.

## 2017-01-29 ENCOUNTER — Encounter: Payer: Self-pay | Admitting: Internal Medicine

## 2017-03-10 ENCOUNTER — Other Ambulatory Visit: Payer: Self-pay | Admitting: Internal Medicine

## 2017-04-03 ENCOUNTER — Telehealth: Payer: Self-pay | Admitting: Internal Medicine

## 2017-04-03 NOTE — Telephone Encounter (Signed)
Check on her on Monday Apologize for no appt slots

## 2017-04-03 NOTE — Telephone Encounter (Signed)
Patient Name: [name]  DOB: 1951-08-15    Initial Comment Caller states, she has tenderness under the hair line of her lower right eye lid. She has bump on side of the eye lid. She is having dryness and discharge. Verified    Nurse Assessment  Nurse: Ardine Bjork, RN, Melissa Date/Time (Eastern Time): 04/03/2017 10:41:53 AM  Confirm and document reason for call. If symptomatic, describe symptoms. ---Caller states, she has tenderness under the hair line of her lower right eye lid. She has bump on side of the eye lid. She is having dryness and discharge. Sxs started last Sunday.  Does the patient have any new or worsening symptoms? ---Yes  Will a triage be completed? ---Yes  Related visit to physician within the last 2 weeks? ---No  Does the PT have any chronic conditions? (i.e. diabetes, asthma, etc.) ---Yes  List chronic conditions. ---Sertraline-depression  Is this a behavioral health or substance abuse call? ---No     Guidelines    Guideline Title Affirmed Question Affirmed Notes  Eye - Swelling Eyelid is red and painful (or tender to touch)    Final Disposition User   See Physician within [facility], RN, Onyx states she was told no office appts today-declines to go to [facility] she will go to walk-in clinic for eval.   Referrals  [facility]   Disagree/Comply: Comply

## 2017-04-06 NOTE — Telephone Encounter (Signed)
Tried home number with no answer. Left message on cell.

## 2017-04-06 NOTE — Telephone Encounter (Signed)
Spoke to pt.She said she went to a [facility] and was told she had a stye. On an antibiotic eye drop tobradex 0.03%. She can already tell a difference!

## 2017-05-13 ENCOUNTER — Ambulatory Visit (INDEPENDENT_AMBULATORY_CARE_PROVIDER_SITE_OTHER): Payer: Medicare Other | Admitting: Orthopedic Surgery

## 2017-05-13 ENCOUNTER — Encounter (INDEPENDENT_AMBULATORY_CARE_PROVIDER_SITE_OTHER): Payer: Self-pay | Admitting: Orthopedic Surgery

## 2017-05-13 ENCOUNTER — Ambulatory Visit (INDEPENDENT_AMBULATORY_CARE_PROVIDER_SITE_OTHER): Payer: Medicare Other

## 2017-05-13 VITALS — BP 120/78 | HR 89 | Resp 14 | Ht 65.0 in | Wt 145.0 lb

## 2017-05-13 DIAGNOSIS — M7712 Lateral epicondylitis, left elbow: Secondary | ICD-10-CM

## 2017-05-13 DIAGNOSIS — M19022 Primary osteoarthritis, left elbow: Secondary | ICD-10-CM | POA: Diagnosis not present

## 2017-05-13 DIAGNOSIS — G8929 Other chronic pain: Secondary | ICD-10-CM | POA: Diagnosis not present

## 2017-05-13 DIAGNOSIS — M25522 Pain in left elbow: Secondary | ICD-10-CM | POA: Diagnosis not present

## 2017-05-13 MED ORDER — BUPIVACAINE HCL 0.5 % IJ SOLN
1.0000 mL | INTRAMUSCULAR | Status: AC | PRN
  Administered 2017-05-13: 1 mL via INTRA_ARTICULAR

## 2017-05-13 MED ORDER — LIDOCAINE HCL 1 % IJ SOLN
2.0000 mL | INTRAMUSCULAR | Status: AC | PRN
  Administered 2017-05-13: 2 mL

## 2017-05-13 MED ORDER — METHYLPREDNISOLONE ACETATE 40 MG/ML IJ SUSP
30.0000 mg | INTRAMUSCULAR | Status: AC | PRN
  Administered 2017-05-13: 30 mg via INTRA_ARTICULAR

## 2017-05-13 MED ORDER — TRAMADOL HCL 50 MG PO TABS: 50.0000 mg | ORAL_TABLET | Freq: Four times a day (QID) | ORAL | 0 refills | Status: DC | PRN
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
## 2017-06-10 ENCOUNTER — Encounter: Payer: Self-pay | Admitting: Internal Medicine

## 2017-06-10 ENCOUNTER — Ambulatory Visit (INDEPENDENT_AMBULATORY_CARE_PROVIDER_SITE_OTHER): Payer: Medicare Other | Admitting: Internal Medicine

## 2017-06-10 VITALS — BP 122/84 | HR 92 | Temp 98.4°F | Resp 16 | Wt 152.0 lb

## 2017-06-10 DIAGNOSIS — J0100 Acute maxillary sinusitis, unspecified: Secondary | ICD-10-CM

## 2017-06-10 MED ORDER — DOXYCYCLINE HYCLATE 100 MG PO TABS: 100.0000 mg | ORAL_TABLET | Freq: Two times a day (BID) | ORAL | 0 refills | Status: DC

## 2017-06-10 NOTE — Assessment & Plan Note (Signed)
2 weeks of symptoms and worsening Discussed the nasal steroid again Other supportive care Will try doxy
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
## 2017-06-11 NOTE — Addendum Note (Signed)
Addended by: Biagio Borg on: 06/11/2017 02:38 PM   Modules accepted: Level of Service

## 2017-07-20 ENCOUNTER — Encounter: Payer: Self-pay | Admitting: Internal Medicine

## 2017-07-20 ENCOUNTER — Ambulatory Visit (INDEPENDENT_AMBULATORY_CARE_PROVIDER_SITE_OTHER): Payer: Medicare Other | Admitting: Internal Medicine

## 2017-07-20 ENCOUNTER — Ambulatory Visit (INDEPENDENT_AMBULATORY_CARE_PROVIDER_SITE_OTHER)
Admission: RE | Admit: 2017-07-20 | Discharge: 2017-07-20 | Disposition: A | Discharge status: Home / Self Care | Payer: Medicare Other | Source: Ambulatory Visit | Attending: Internal Medicine | Admitting: Internal Medicine

## 2017-07-20 VITALS — BP 134/80 | HR 100 | Temp 99.2°F | Resp 12 | Wt 151.8 lb

## 2017-07-20 DIAGNOSIS — R059 Cough, unspecified: Secondary | ICD-10-CM | POA: Insufficient documentation

## 2017-07-20 DIAGNOSIS — N3000 Acute cystitis without hematuria: Secondary | ICD-10-CM | POA: Diagnosis not present

## 2017-07-20 DIAGNOSIS — R300 Dysuria: Secondary | ICD-10-CM | POA: Diagnosis not present

## 2017-07-20 DIAGNOSIS — R05 Cough: Secondary | ICD-10-CM

## 2017-07-20 LAB — POC URINALSYSI DIPSTICK (AUTOMATED)
Bilirubin, UA: NEGATIVE
Glucose, UA: NEGATIVE
Ketones, UA: NEGATIVE
Nitrite, UA: NEGATIVE
PH UA: 6 (ref 5.0–8.0)
PROTEIN UA: 15
Spec Grav, UA: >=1.03 — AB (ref 1.010–1.025)
Urobilinogen, UA: 0.2 E.U./dL

## 2017-07-20 MED ORDER — SULFAMETHOXAZOLE-TRIMETHOPRIM 800-160 MG PO TABS: 1.0000 | ORAL_TABLET | Freq: Two times a day (BID) | ORAL | 0 refills | Status: DC
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
## 2017-07-20 NOTE — Assessment & Plan Note (Signed)
Some systemic symptoms Will treat with septra--- hopefully only 3-5 days--but will give 7

## 2017-07-20 NOTE — Patient Instructions (Signed)
Please start the antibiotic today and take it twice daily. If you feel better after the first or second dose--just take 3 days. If it takes only 1-2 days to resolve, take it for 5 days

## 2017-07-20 NOTE — Assessment & Plan Note (Signed)
Persists Not pneumonia clinically Will check CXR to make sure no unexpected findings

## 2017-07-24 ENCOUNTER — Encounter: Payer: Self-pay | Admitting: Internal Medicine

## 2017-07-24 ENCOUNTER — Telehealth: Payer: Self-pay | Admitting: Internal Medicine

## 2017-07-24 DIAGNOSIS — M25522 Pain in left elbow: Secondary | ICD-10-CM

## 2017-07-24 NOTE — Telephone Encounter (Signed)
Left message asking pt to call the office regarding referral  I had to fax referral to [facility] orthopedics Dr Amedeo Plenty office they will contact pt to schedule appointment  Notes faxed

## 2017-08-13 ENCOUNTER — Ambulatory Visit (INDEPENDENT_AMBULATORY_CARE_PROVIDER_SITE_OTHER): Payer: Medicare Other

## 2017-08-13 DIAGNOSIS — Z23 Encounter for immunization: Secondary | ICD-10-CM | POA: Diagnosis not present

## 2017-08-28 ENCOUNTER — Other Ambulatory Visit: Payer: Self-pay | Admitting: Orthopedic Surgery

## 2017-08-28 DIAGNOSIS — M19022 Primary osteoarthritis, left elbow: Secondary | ICD-10-CM

## 2017-09-02 ENCOUNTER — Ambulatory Visit
Admission: RE | Admit: 2017-09-02 | Discharge: 2017-09-02 | Disposition: A | Discharge status: Home / Self Care | Payer: Medicare Other | Source: Ambulatory Visit | Attending: Orthopedic Surgery | Admitting: Orthopedic Surgery

## 2017-09-02 DIAGNOSIS — M19022 Primary osteoarthritis, left elbow: Secondary | ICD-10-CM

## 2017-10-22 ENCOUNTER — Ambulatory Visit: Payer: Self-pay | Admitting: Orthopedic Surgery

## 2017-10-27 ENCOUNTER — Other Ambulatory Visit: Payer: Self-pay

## 2017-10-27 ENCOUNTER — Encounter (HOSPITAL_COMMUNITY): Payer: Self-pay

## 2017-10-27 ENCOUNTER — Encounter (HOSPITAL_COMMUNITY)
Admission: RE | Admit: 2017-10-27 | Discharge: 2017-10-27 | Disposition: A | Discharge status: Home / Self Care | Payer: Medicare Other | Source: Ambulatory Visit | Attending: Orthopedic Surgery | Admitting: Orthopedic Surgery

## 2017-10-27 DIAGNOSIS — M19022 Primary osteoarthritis, left elbow: Secondary | ICD-10-CM | POA: Diagnosis not present

## 2017-10-27 DIAGNOSIS — F39 Unspecified mood [affective] disorder: Secondary | ICD-10-CM | POA: Diagnosis not present

## 2017-10-27 DIAGNOSIS — F329 Major depressive disorder, single episode, unspecified: Secondary | ICD-10-CM | POA: Diagnosis not present

## 2017-10-27 DIAGNOSIS — E785 Hyperlipidemia, unspecified: Secondary | ICD-10-CM | POA: Diagnosis not present

## 2017-10-27 DIAGNOSIS — M84322A Stress fracture, left humerus, initial encounter for fracture: Secondary | ICD-10-CM | POA: Diagnosis not present

## 2017-10-27 DIAGNOSIS — I350 Nonrheumatic aortic (valve) stenosis: Secondary | ICD-10-CM | POA: Diagnosis not present

## 2017-10-27 DIAGNOSIS — M85822 Other specified disorders of bone density and structure, left upper arm: Secondary | ICD-10-CM | POA: Diagnosis not present

## 2017-10-27 DIAGNOSIS — Z79899 Other long term (current) drug therapy: Secondary | ICD-10-CM | POA: Diagnosis not present

## 2017-10-27 DIAGNOSIS — Z880 Allergy status to penicillin: Secondary | ICD-10-CM | POA: Diagnosis not present

## 2017-10-27 DIAGNOSIS — F419 Anxiety disorder, unspecified: Secondary | ICD-10-CM | POA: Diagnosis not present

## 2017-10-27 DIAGNOSIS — G8929 Other chronic pain: Secondary | ICD-10-CM | POA: Diagnosis not present

## 2017-10-27 DIAGNOSIS — G5622 Lesion of ulnar nerve, left upper limb: Secondary | ICD-10-CM | POA: Diagnosis not present

## 2017-10-27 DIAGNOSIS — K219 Gastro-esophageal reflux disease without esophagitis: Secondary | ICD-10-CM | POA: Diagnosis not present

## 2017-10-27 DIAGNOSIS — R011 Cardiac murmur, unspecified: Secondary | ICD-10-CM | POA: Diagnosis not present

## 2017-10-27 DIAGNOSIS — M65822 Other synovitis and tenosynovitis, left upper arm: Secondary | ICD-10-CM | POA: Diagnosis not present

## 2017-10-27 DIAGNOSIS — Z888 Allergy status to other drugs, medicaments and biological substances status: Secondary | ICD-10-CM | POA: Diagnosis not present

## 2017-10-27 HISTORY — DX: Primary osteoarthritis, left elbow: M19.022

## 2017-10-27 LAB — BASIC METABOLIC PANEL
Anion gap: 8 (ref 5–15)
BUN: 16 mg/dL (ref 6–20)
CALCIUM: 9.7 mg/dL (ref 8.9–10.3)
CHLORIDE: 104 mmol/L (ref 101–111)
CO2: 26 mmol/L (ref 22–32)
CREATININE: 0.64 mg/dL (ref 0.44–1.00)
Glucose, Bld: 97 mg/dL (ref 65–99)
Potassium: 3.9 mmol/L (ref 3.5–5.1)
SODIUM: 138 mmol/L (ref 135–145)

## 2017-10-27 LAB — CBC
HCT: 40.4 % (ref 36.0–46.0)
Hemoglobin: 13.4 g/dL (ref 12.0–15.0)
MCH: 31.6 pg (ref 26.0–34.0)
MCHC: 33.2 g/dL (ref 30.0–36.0)
MCV: 95.3 fL (ref 78.0–100.0)
PLATELETS: 256 10*3/uL (ref 150–400)
RBC: 4.24 MIL/uL (ref 3.87–5.11)
RDW: 13.6 % (ref 11.5–15.5)
WBC: 5.6 10*3/uL (ref 4.0–10.5)

## 2017-10-27 LAB — SURGICAL PCR SCREEN
MRSA, PCR: NEGATIVE
STAPHYLOCOCCUS AUREUS: NEGATIVE

## 2017-10-27 NOTE — Pre-Procedure Instructions (Signed)
Gordon Carlson  10/27/2017      [id number] - [place], [place] [facility] [place]  [address] Phone: [phone] Fax: [phone]    Your procedure is scheduled on Thursday 10/29/2017.  Report to [facility] Admitting at 1100 A.M.  Call this number if you have problems the morning of surgery:  [phone]   Remember:  Do not eat food or drink liquids after midnight.   Continue all medications as directed by your physician except follow these medication instructions before surgery below   Take these medicines the morning of surgery with A SIP OF WATER : Sertraline (Zoloft) Tramadol (Ultram) - if needed trimcinolone (Nasacort) nasal inhaler   7 days prior to surgery STOP taking any Aspirin(unless otherwise instructed by your surgeon), Aleve, Naproxen, Ibuprofen, Motrin, Advil, Goody's, BC's, all herbal medications, fish oil, and all vitamins    Do not wear jewelry, make-up or nail polish.  Do not wear lotions, powders, or perfumes, or deodorant.  Do not shave 48 hours prior to surgery.   Do not bring valuables to the hospital.  [facility] is not responsible for any belongings or valuables.  Contacts, eyeglasses, dentures or bridgework may not be worn into surgery.  Leave your suitcase in the car.  After surgery it may be brought to your room.  For patients admitted to the hospital, discharge time will be determined by your treatment team.  Patients discharged the day of surgery will not be allowed to drive home.   Name and phone number of your driver:    Special instructions:   [place]- Preparing For Surgery  Before surgery, you can play an important role. Because skin is not sterile, your skin needs to be as free of germs as possible. You can reduce the number of germs on your skin by washing with CHG (chlorahexidine gluconate) Soap before surgery.  CHG is an antiseptic cleaner which kills germs and bonds with the skin  to continue killing germs even after washing.  Please do not use if you have an allergy to CHG or antibacterial soaps. If your skin becomes reddened/irritated stop using the CHG.  Do not shave (including legs and underarms) for at least 48 hours prior to first CHG shower. It is OK to shave your face.  Please follow these instructions carefully.   1. Shower the NIGHT BEFORE SURGERY and the MORNING OF SURGERY with CHG.   2. If you chose to wash your hair, wash your hair first as usual with your normal shampoo.  3. After you shampoo, rinse your hair and body thoroughly to remove the shampoo.  4. Use CHG as you would any other liquid soap. You can apply CHG directly to the skin and wash gently with a scrungie or a clean washcloth.   5. Apply the CHG Soap to your body ONLY FROM THE NECK DOWN.  Do not use on open wounds or open sores. Avoid contact with your eyes, ears, mouth and genitals (private parts). Wash Face and genitals (private parts)  with your normal soap.  6. Wash thoroughly, paying special attention to the area where your surgery will be performed.  7. Thoroughly rinse your body with warm water from the neck down.  8. DO NOT shower/wash with your normal soap after using and rinsing off the CHG Soap.  9. Pat yourself dry with a CLEAN TOWEL.  10. Wear CLEAN PAJAMAS to bed the night before surgery, wear comfortable clothes the  morning of surgery  11. Place CLEAN SHEETS on your bed the night of your first shower and DO NOT SLEEP WITH PETS.    Day of Surgery: Shower as stated above. Do not apply any deodorants/lotions. Please wear clean clothes to the hospital/surgery center.      Please read over the following fact sheets that you were given.

## 2017-10-27 NOTE — Progress Notes (Signed)
PCP - Dr. Silvio Pate  Cardiologist - Denies  Chest x-ray - 07/20/17 (E)  EKG - Denies  Stress Test - Denies  ECHO - 05/10/16 (E)  Cardiac Cath - Denies  Sleep Study - Denies CPAP - None  LABS- Pending: CBC, BMP  Pt sts her heart murmur was cleared with the Echo, therefore she does not have to be followed by a cardiologist.  Pt denies having chest pain, sob, or fever at this time. All instructions explained to the pt, with a verbal understanding of the material. Pt agrees to go over the instructions while at home for a better understanding. The opportunity to ask questions was provided.

## 2017-10-28 MED ORDER — VANCOMYCIN HCL 10 G IV SOLR
1250.0000 mg | INTRAVENOUS | Status: AC
  Administered 2017-10-29: 1250 mg via INTRAVENOUS
  Filled 2017-10-28: qty 1250

## 2017-10-29 ENCOUNTER — Observation Stay (HOSPITAL_COMMUNITY)
Admission: RE | Admit: 2017-10-29 | Discharge: 2017-10-31 | Disposition: A | Discharge status: Home / Self Care | Payer: Medicare Other | Source: Ambulatory Visit | Attending: Orthopedic Surgery | Admitting: Orthopedic Surgery

## 2017-10-29 ENCOUNTER — Encounter (HOSPITAL_COMMUNITY)
Admission: RE | Disposition: A | Discharge status: Home / Self Care | Payer: Self-pay | Source: Ambulatory Visit | Attending: Orthopedic Surgery

## 2017-10-29 ENCOUNTER — Other Ambulatory Visit: Payer: Self-pay

## 2017-10-29 ENCOUNTER — Ambulatory Visit (HOSPITAL_COMMUNITY): Payer: Medicare Other | Admitting: Certified Registered"

## 2017-10-29 ENCOUNTER — Encounter (HOSPITAL_COMMUNITY): Payer: Self-pay | Admitting: General Practice

## 2017-10-29 ENCOUNTER — Ambulatory Visit (HOSPITAL_COMMUNITY): Payer: Medicare Other

## 2017-10-29 DIAGNOSIS — M85822 Other specified disorders of bone density and structure, left upper arm: Secondary | ICD-10-CM | POA: Diagnosis not present

## 2017-10-29 DIAGNOSIS — Z79899 Other long term (current) drug therapy: Secondary | ICD-10-CM | POA: Insufficient documentation

## 2017-10-29 DIAGNOSIS — Z888 Allergy status to other drugs, medicaments and biological substances status: Secondary | ICD-10-CM | POA: Insufficient documentation

## 2017-10-29 DIAGNOSIS — Z880 Allergy status to penicillin: Secondary | ICD-10-CM | POA: Insufficient documentation

## 2017-10-29 DIAGNOSIS — R011 Cardiac murmur, unspecified: Secondary | ICD-10-CM | POA: Insufficient documentation

## 2017-10-29 DIAGNOSIS — G5622 Lesion of ulnar nerve, left upper limb: Secondary | ICD-10-CM | POA: Insufficient documentation

## 2017-10-29 DIAGNOSIS — F419 Anxiety disorder, unspecified: Secondary | ICD-10-CM | POA: Insufficient documentation

## 2017-10-29 DIAGNOSIS — M19022 Primary osteoarthritis, left elbow: Secondary | ICD-10-CM | POA: Diagnosis not present

## 2017-10-29 DIAGNOSIS — M84322A Stress fracture, left humerus, initial encounter for fracture: Secondary | ICD-10-CM | POA: Insufficient documentation

## 2017-10-29 DIAGNOSIS — F39 Unspecified mood [affective] disorder: Secondary | ICD-10-CM | POA: Insufficient documentation

## 2017-10-29 DIAGNOSIS — M65822 Other synovitis and tenosynovitis, left upper arm: Secondary | ICD-10-CM | POA: Insufficient documentation

## 2017-10-29 DIAGNOSIS — Z96622 Presence of left artificial elbow joint: Secondary | ICD-10-CM

## 2017-10-29 DIAGNOSIS — E785 Hyperlipidemia, unspecified: Secondary | ICD-10-CM | POA: Insufficient documentation

## 2017-10-29 DIAGNOSIS — K219 Gastro-esophageal reflux disease without esophagitis: Secondary | ICD-10-CM | POA: Insufficient documentation

## 2017-10-29 DIAGNOSIS — F329 Major depressive disorder, single episode, unspecified: Secondary | ICD-10-CM | POA: Insufficient documentation

## 2017-10-29 DIAGNOSIS — I350 Nonrheumatic aortic (valve) stenosis: Secondary | ICD-10-CM | POA: Insufficient documentation

## 2017-10-29 DIAGNOSIS — G8929 Other chronic pain: Secondary | ICD-10-CM | POA: Insufficient documentation

## 2017-10-29 HISTORY — PX: TOTAL ELBOW ARTHROPLASTY: SHX812

## 2017-10-29 SURGERY — ARTHROPLASTY, ELBOW, TOTAL
Anesthesia: Regional | Laterality: Left

## 2017-10-29 MED ORDER — VITAMIN C 500 MG PO TABS
1000.0000 mg | ORAL_TABLET | Freq: Every day | ORAL | Status: DC
  Administered 2017-10-30 – 2017-10-31 (×2): 1000 mg via ORAL
  Filled 2017-10-29 (×2): qty 2

## 2017-10-29 MED ORDER — PHENYLEPHRINE HCL 10 MG/ML IJ SOLN
INTRAMUSCULAR | Status: DC | PRN
  Administered 2017-10-29: 15 ug/min via INTRAVENOUS

## 2017-10-29 MED ORDER — PROMETHAZINE HCL 12.5 MG RE SUPP
12.5000 mg | Freq: Four times a day (QID) | RECTAL | Status: DC | PRN
  Filled 2017-10-29: qty 1

## 2017-10-29 MED ORDER — LIDOCAINE 2% (20 MG/ML) 5 ML SYRINGE
INTRAMUSCULAR | Status: AC
Start: 2017-10-29 — End: ?
  Filled 2017-10-29: qty 5

## 2017-10-29 MED ORDER — FENTANYL CITRATE (PF) 100 MCG/2ML IJ SOLN: 25.0000 ug | INTRAMUSCULAR | Status: DC | PRN

## 2017-10-29 MED ORDER — SUGAMMADEX SODIUM 200 MG/2ML IV SOLN
INTRAVENOUS | Status: DC | PRN
  Administered 2017-10-29: 200 mg via INTRAVENOUS

## 2017-10-29 MED ORDER — PROPOFOL 10 MG/ML IV BOLUS
INTRAVENOUS | Status: AC
  Filled 2017-10-29: qty 20

## 2017-10-29 MED ORDER — FENTANYL CITRATE (PF) 250 MCG/5ML IJ SOLN
INTRAMUSCULAR | Status: AC
  Filled 2017-10-29: qty 5

## 2017-10-29 MED ORDER — EPINEPHRINE PF 1 MG/ML IJ SOLN
INTRAMUSCULAR | Status: AC
  Filled 2017-10-29: qty 1

## 2017-10-29 MED ORDER — ONDANSETRON HCL 4 MG/2ML IJ SOLN: 4.0000 mg | Freq: Once | INTRAMUSCULAR | Status: DC | PRN

## 2017-10-29 MED ORDER — ROCURONIUM BROMIDE 100 MG/10ML IV SOLN
INTRAVENOUS | Status: DC | PRN
  Administered 2017-10-29: 40 mg via INTRAVENOUS
  Administered 2017-10-29 (×3): 10 mg via INTRAVENOUS
  Administered 2017-10-29: 20 mg via INTRAVENOUS
  Administered 2017-10-29: 10 mg via INTRAVENOUS

## 2017-10-29 MED ORDER — ONDANSETRON HCL 4 MG PO TABS: 4.0000 mg | ORAL_TABLET | Freq: Four times a day (QID) | ORAL | Status: DC | PRN

## 2017-10-29 MED ORDER — PHENYLEPHRINE 40 MCG/ML (10ML) SYRINGE FOR IV PUSH (FOR BLOOD PRESSURE SUPPORT)
PREFILLED_SYRINGE | INTRAVENOUS | Status: DC | PRN
  Administered 2017-10-29: 40 ug via INTRAVENOUS
  Administered 2017-10-29: 80 ug via INTRAVENOUS
  Administered 2017-10-29: 120 ug via INTRAVENOUS
  Administered 2017-10-29 (×2): 80 ug via INTRAVENOUS

## 2017-10-29 MED ORDER — MIDAZOLAM HCL 2 MG/2ML IJ SOLN
2.0000 mg | Freq: Once | INTRAMUSCULAR | Status: AC
  Administered 2017-10-29: 2 mg via INTRAVENOUS

## 2017-10-29 MED ORDER — ALPRAZOLAM 0.5 MG PO TABS: 0.5000 mg | ORAL_TABLET | Freq: Four times a day (QID) | ORAL | Status: DC | PRN

## 2017-10-29 MED ORDER — LIDOCAINE HCL (CARDIAC) 20 MG/ML IV SOLN
INTRAVENOUS | Status: DC | PRN
  Administered 2017-10-29: 40 mg via INTRAVENOUS

## 2017-10-29 MED ORDER — ADAPALENE 0.1 % EX GEL: 1.0000 "application " | Freq: Every day | CUTANEOUS | Status: DC

## 2017-10-29 MED ORDER — BUPIVACAINE HCL (PF) 0.25 % IJ SOLN
INTRAMUSCULAR | Status: AC
  Filled 2017-10-29: qty 30

## 2017-10-29 MED ORDER — LACTATED RINGERS IV SOLN
INTRAVENOUS | Status: DC
  Administered 2017-10-29: 11:00:00 via INTRAVENOUS

## 2017-10-29 MED ORDER — SERTRALINE HCL 50 MG PO TABS
50.0000 mg | ORAL_TABLET | Freq: Every day | ORAL | Status: DC
  Administered 2017-10-30 – 2017-10-31 (×2): 50 mg via ORAL
  Filled 2017-10-29 (×2): qty 1

## 2017-10-29 MED ORDER — ROCURONIUM BROMIDE 10 MG/ML (PF) SYRINGE
PREFILLED_SYRINGE | INTRAVENOUS | Status: AC
  Filled 2017-10-29: qty 5

## 2017-10-29 MED ORDER — HYDROCORTISONE 1 % EX CREA
TOPICAL_CREAM | Freq: Two times a day (BID) | CUTANEOUS | Status: DC
  Filled 2017-10-29: qty 28

## 2017-10-29 MED ORDER — MIDAZOLAM HCL 2 MG/2ML IJ SOLN
INTRAMUSCULAR | Status: AC
Start: 2017-10-29 — End: ?
  Filled 2017-10-29: qty 2

## 2017-10-29 MED ORDER — FAMOTIDINE 20 MG PO TABS: 20.0000 mg | ORAL_TABLET | Freq: Two times a day (BID) | ORAL | Status: DC | PRN

## 2017-10-29 MED ORDER — PROPOFOL 10 MG/ML IV BOLUS
INTRAVENOUS | Status: DC | PRN
  Administered 2017-10-29: 130 mg via INTRAVENOUS

## 2017-10-29 MED ORDER — ROPIVACAINE HCL 7.5 MG/ML IJ SOLN
INTRAMUSCULAR | Status: DC | PRN
  Administered 2017-10-29: 20 mL via PERINEURAL

## 2017-10-29 MED ORDER — ADULT MULTIVITAMIN W/MINERALS CH
1.0000 | ORAL_TABLET | Freq: Every day | ORAL | Status: DC
  Administered 2017-10-30 – 2017-10-31 (×2): 1 via ORAL
  Filled 2017-10-29 (×2): qty 1

## 2017-10-29 MED ORDER — METHOCARBAMOL 1000 MG/10ML IJ SOLN
500.0000 mg | Freq: Four times a day (QID) | INTRAVENOUS | Status: DC | PRN
  Filled 2017-10-29: qty 5

## 2017-10-29 MED ORDER — SODIUM CHLORIDE 0.45 % IV SOLN
INTRAVENOUS | Status: DC
  Administered 2017-10-29: 20:00:00 via INTRAVENOUS

## 2017-10-29 MED ORDER — METHOCARBAMOL 500 MG PO TABS
500.0000 mg | ORAL_TABLET | Freq: Four times a day (QID) | ORAL | Status: DC | PRN
  Administered 2017-10-30 – 2017-10-31 (×4): 500 mg via ORAL
  Filled 2017-10-29 (×4): qty 1

## 2017-10-29 MED ORDER — ONDANSETRON HCL 4 MG/2ML IJ SOLN
INTRAMUSCULAR | Status: DC | PRN
  Administered 2017-10-29: 4 mg via INTRAVENOUS

## 2017-10-29 MED ORDER — ONDANSETRON HCL 4 MG/2ML IJ SOLN: 4.0000 mg | Freq: Four times a day (QID) | INTRAMUSCULAR | Status: DC | PRN

## 2017-10-29 MED ORDER — PHENYLEPHRINE 40 MCG/ML (10ML) SYRINGE FOR IV PUSH (FOR BLOOD PRESSURE SUPPORT)
PREFILLED_SYRINGE | INTRAVENOUS | Status: AC
  Filled 2017-10-29: qty 10

## 2017-10-29 MED ORDER — MIDAZOLAM HCL 2 MG/2ML IJ SOLN
INTRAMUSCULAR | Status: AC
  Filled 2017-10-29: qty 2

## 2017-10-29 MED ORDER — DEXAMETHASONE SODIUM PHOSPHATE 10 MG/ML IJ SOLN
INTRAMUSCULAR | Status: AC
  Filled 2017-10-29: qty 1

## 2017-10-29 MED ORDER — VANCOMYCIN HCL IN DEXTROSE 750-5 MG/150ML-% IV SOLN
750.0000 mg | Freq: Two times a day (BID) | INTRAVENOUS | Status: AC
  Administered 2017-10-30 (×2): 750 mg via INTRAVENOUS
  Filled 2017-10-29 (×2): qty 150

## 2017-10-29 MED ORDER — SENNOSIDES-DOCUSATE SODIUM 8.6-50 MG PO TABS: 1.0000 | ORAL_TABLET | Freq: Every evening | ORAL | Status: DC | PRN

## 2017-10-29 MED ORDER — OXYCODONE HCL 5 MG PO TABS
5.0000 mg | ORAL_TABLET | ORAL | Status: DC | PRN
  Administered 2017-10-30 (×4): 10 mg via ORAL
  Administered 2017-10-30: 5 mg via ORAL
  Administered 2017-10-31 (×3): 10 mg via ORAL
  Filled 2017-10-29 (×3): qty 2
  Filled 2017-10-29: qty 1
  Filled 2017-10-29 (×4): qty 2

## 2017-10-29 MED ORDER — FENTANYL CITRATE (PF) 250 MCG/5ML IJ SOLN
INTRAMUSCULAR | Status: DC | PRN
  Administered 2017-10-29: 100 ug via INTRAVENOUS

## 2017-10-29 MED ORDER — 0.9 % SODIUM CHLORIDE (POUR BTL) OPTIME
TOPICAL | Status: DC | PRN
  Administered 2017-10-29 (×2): 1000 mL

## 2017-10-29 MED ORDER — DEXAMETHASONE SODIUM PHOSPHATE 10 MG/ML IJ SOLN
INTRAMUSCULAR | Status: DC | PRN
  Administered 2017-10-29: 5 mg via INTRAVENOUS

## 2017-10-29 MED ORDER — MORPHINE SULFATE (PF) 2 MG/ML IV SOLN: 1.0000 mg | INTRAVENOUS | Status: DC | PRN

## 2017-10-29 MED ORDER — TRIAMCINOLONE ACETONIDE 55 MCG/ACT NA AERO
1.0000 | INHALATION_SPRAY | Freq: Every day | NASAL | Status: DC
  Filled 2017-10-29: qty 10.8

## 2017-10-29 MED ORDER — VANCOMYCIN HCL 1000 MG IV SOLR
INTRAVENOUS | Status: DC | PRN
  Administered 2017-10-29: 1250 mg via INTRAVENOUS

## 2017-10-29 MED ORDER — FENTANYL CITRATE (PF) 100 MCG/2ML IJ SOLN
INTRAMUSCULAR | Status: AC
  Filled 2017-10-29: qty 2

## 2017-10-29 MED ORDER — CHLORHEXIDINE GLUCONATE 4 % EX LIQD: 60.0000 mL | Freq: Once | CUTANEOUS | Status: DC

## 2017-10-29 SURGICAL SUPPLY — 92 items
BANDAGE ACE 3X5.8 VEL STRL LF (GAUZE/BANDAGES/DRESSINGS) ×1 IMPLANT
BANDAGE ACE 4X5 VEL STRL LF (GAUZE/BANDAGES/DRESSINGS) ×4 IMPLANT
BIT DRILL MINI LNG ACUTRAK 2 (BIT) IMPLANT
BLADE LONG MED 31X9 (MISCELLANEOUS) IMPLANT
BLADE SURG 10 STRL SS (BLADE) ×4 IMPLANT
BLADE SURG 15 STRL LF DISP TIS (BLADE) ×2 IMPLANT
BLADE SURG 15 STRL SS (BLADE) ×4
BNDG CMPR 9X4 STRL LF SNTH (GAUZE/BANDAGES/DRESSINGS) ×1
BNDG ESMARK 4X9 LF (GAUZE/BANDAGES/DRESSINGS) ×2 IMPLANT
BNDG GAUZE ELAST 4 BULKY (GAUZE/BANDAGES/DRESSINGS) ×6 IMPLANT
BUR ROUND FLUTED 4 SOFT TCH (BURR) ×1 IMPLANT
CEMENT BONE W/GENTAMICIN 40/20 (Cement) ×1 IMPLANT
CEMENT FEMORAL STRL SM 6MM (Cement) ×1 IMPLANT
CORD BIPOLAR FORCEPS 12FT (ELECTRODE) ×1 IMPLANT
CORDS BIPOLAR (ELECTRODE) ×2 IMPLANT
COVER SURGICAL LIGHT HANDLE (MISCELLANEOUS) ×3 IMPLANT
CUFF TOURNIQUET SINGLE 18IN (TOURNIQUET CUFF) ×2 IMPLANT
CUFF TOURNIQUET SINGLE 24IN (TOURNIQUET CUFF) IMPLANT
DRAPE INCISE IOBAN 66X45 STRL (DRAPES) ×2 IMPLANT
DRAPE OEC MINIVIEW 54X84 (DRAPES) IMPLANT
DRAPE SURG 17X23 STRL (DRAPES) ×2 IMPLANT
DRAPE U-SHAPE 47X51 STRL (DRAPES) ×1 IMPLANT
DRILL MINI LNG ACUTRAK 2 (BIT) ×2
DRSG ADAPTIC 3X8 NADH LF (GAUZE/BANDAGES/DRESSINGS) ×2 IMPLANT
ELECT REM PT RETURN 9FT ADLT (ELECTROSURGICAL) ×2
ELECTRODE REM PT RTRN 9FT ADLT (ELECTROSURGICAL) ×1 IMPLANT
EVACUATOR 1/8 PVC DRAIN (DRAIN) ×1 IMPLANT
GAUZE SPONGE 4X4 12PLY STRL (GAUZE/BANDAGES/DRESSINGS) ×2 IMPLANT
GAUZE XEROFORM 5X9 LF (GAUZE/BANDAGES/DRESSINGS) ×1 IMPLANT
GLOVE BIOGEL M 8.0 STRL (GLOVE) ×2 IMPLANT
GLOVE SURG ORTHO 8.0 STRL STRW (GLOVE) ×2 IMPLANT
GOWN STRL REUS W/ TWL LRG LVL3 (GOWN DISPOSABLE) ×2 IMPLANT
GOWN STRL REUS W/ TWL XL LVL3 (GOWN DISPOSABLE) ×3 IMPLANT
GOWN STRL REUS W/TWL LRG LVL3 (GOWN DISPOSABLE) ×4
GOWN STRL REUS W/TWL XL LVL3 (GOWN DISPOSABLE) ×6
GUIDEWIRE ORTHO MINI ACTK .045 (WIRE) ×3 IMPLANT
HANDPIECE INTERPULSE COAX TIP (DISPOSABLE)
HUMERAL CON SET-HEXALOBULAR (Orthopedic Implant) ×2 IMPLANT
HUMERAL LEFT 4MMX100MM (Orthopedic Implant) ×1 IMPLANT
KIT BASIN OR (CUSTOM PROCEDURE TRAY) ×2 IMPLANT
KIT DISC CONDYLE HEXALOBULAR (Orthopedic Implant) IMPLANT
KIT ROOM TURNOVER OR (KITS) ×2 IMPLANT
KIT TOTAL KNEE OPTIVAC 40/80GM (Knees) ×1 IMPLANT
LOOP VESSEL MAXI BLUE (MISCELLANEOUS) ×2 IMPLANT
MANIFOLD NEPTUNE II (INSTRUMENTS) ×2 IMPLANT
NDL HYPO 25GX1X1/2 BEV (NEEDLE) ×1 IMPLANT
NDL STRAIGHT KEITH (NEEDLE) ×1 IMPLANT
NEEDLE HYPO 25GX1X1/2 BEV (NEEDLE) ×2 IMPLANT
NEEDLE STRAIGHT KEITH (NEEDLE) ×2 IMPLANT
NOZZLE CEMENT SMALL (Cement) ×2 IMPLANT
NS IRRIG 1000ML POUR BTL (IV SOLUTION) ×2 IMPLANT
PACK ORTHO EXTREMITY (CUSTOM PROCEDURE TRAY) ×2 IMPLANT
PAD ARMBOARD 7.5X6 YLW CONV (MISCELLANEOUS) ×4 IMPLANT
PAD CAST 4YDX4 CTTN HI CHSV (CAST SUPPLIES) ×2 IMPLANT
PADDING CAST ABS 4INX4YD NS (CAST SUPPLIES) ×1
PADDING CAST ABS COTTON 4X4 ST (CAST SUPPLIES) IMPLANT
PADDING CAST COTTON 4X4 STRL (CAST SUPPLIES) ×2
PASSER SUT SWANSON 36MM LOOP (INSTRUMENTS) ×3 IMPLANT
PLUG CEMENT CLEARCUT 8MM (Cement) ×1 IMPLANT
SCREW ACUTRAK 2 MINI 26MM (Screw) ×1 IMPLANT
SCREW ACUTRAK 2 MINI 30MM (Screw) ×1 IMPLANT
SCRUB BETADINE 4OZ XXX (MISCELLANEOUS) ×2 IMPLANT
SET CYSTO W/LG BORE CLAMP LF (SET/KITS/TRAYS/PACK) ×1 IMPLANT
SET HNDPC FAN SPRY TIP SCT (DISPOSABLE) IMPLANT
SOL PREP POV-IOD 4OZ 10% (MISCELLANEOUS) ×2 IMPLANT
SPONGE LAP 18X18 X RAY DECT (DISPOSABLE) ×2 IMPLANT
SPONGE LAP 4X18 X RAY DECT (DISPOSABLE) ×2 IMPLANT
STAPLER VISISTAT 35W (STAPLE) IMPLANT
SUCTION FRAZIER HANDLE 10FR (MISCELLANEOUS) ×2
SUCTION TUBE FRAZIER 10FR DISP (MISCELLANEOUS) ×1 IMPLANT
SUT BONE WAX W31G (SUTURE) ×2 IMPLANT
SUT ETHIBOND NAB CT1 #1 30IN (SUTURE) IMPLANT
SUT ETHILON 4 0 FS 1 (SUTURE) ×2 IMPLANT
SUT FIBERWIRE #2 38 REV NDL BL (SUTURE) ×8
SUT FIBERWIRE 2-0 18 17.9 3/8 (SUTURE) ×4
SUT PROLENE 3 0 PS 1 (SUTURE) ×2 IMPLANT
SUT VIC AB 2-0 CT1 27 (SUTURE) ×2
SUT VIC AB 2-0 CT1 TAPERPNT 27 (SUTURE) IMPLANT
SUT VIC AB 3-0 PS1 18 (SUTURE) ×2
SUT VIC AB 3-0 PS1 18XBRD (SUTURE) ×1 IMPLANT
SUTURE FIBERWR 2-0 18 17.9 3/8 (SUTURE) IMPLANT
SUTURE FIBERWR#2 38 REV NDL BL (SUTURE) ×2 IMPLANT
SYR 3ML LL SCALE MARK (SYRINGE) ×2 IMPLANT
SYR CONTROL 10ML LL (SYRINGE) ×2 IMPLANT
TOWEL OR 17X24 6PK STRL BLUE (TOWEL DISPOSABLE) ×2 IMPLANT
TOWEL OR 17X26 10 PK STRL BLUE (TOWEL DISPOSABLE) ×4 IMPLANT
TOWER CARTRIDGE SMART MIX (DISPOSABLE) ×2 IMPLANT
TRAY FOLEY W/METER SILVER 16FR (SET/KITS/TRAYS/PACK) IMPLANT
TUBE CONNECTING 12X1/4 (SUCTIONS) ×2 IMPLANT
ULNA LEFT 3MMX75MM (Orthopedic Implant) ×1 IMPLANT
UNDERPAD 30X30 (UNDERPADS AND DIAPERS) ×2 IMPLANT
WATER STERILE IRR 1000ML POUR (IV SOLUTION) ×2 IMPLANT

## 2017-10-29 NOTE — Transfer of Care (Signed)
Immediate Anesthesia Transfer of Care Note  Patient: [name]  Procedure(s) Performed: Left elbow hardware removal, tenolysis, total elbow arthroplasty with repair reconstruction as necessary (Left )  Patient Location: PACU  Anesthesia Type:GA combined with regional for post-op pain  Level of Consciousness: awake, alert  and oriented  Airway & Oxygen Therapy: Patient Spontanous Breathing and Patient connected to face mask oxygen  Post-op Assessment: Report given to RN and Post -op Vital signs reviewed and stable  Post vital signs: Reviewed and stable  Last Vitals:  Vitals:   10/29/17 1310 10/29/17 1315  BP: (!) 135/59 (!) 127/58  Pulse: 74 85  Resp: (!) 7 (!) 24  Temp:    SpO2: 100% 98%    Last Pain:  Vitals:   10/29/17 1123  TempSrc:   PainSc: 1       Patients Stated Pain Goal: 2 (08/31/11 1975)  Complications: No apparent anesthesia complications

## 2017-10-29 NOTE — Anesthesia Preprocedure Evaluation (Addendum)
Anesthesia Evaluation  Patient identified by MRN, date of birth, ID band Patient awake    Reviewed: Allergy & Precautions, NPO status , Patient's Chart, lab work & pertinent test results  History of Anesthesia Complications (+) PONV and history of anesthetic complications  Airway Mallampati: III  TM Distance: >3 FB Neck ROM: Full    Dental  (+) Teeth Intact, Dental Advisory Given   Pulmonary neg pulmonary ROS,    Pulmonary exam normal        Cardiovascular negative cardio ROS Normal cardiovascular exam Rhythm:Regular Rate:Normal  ECHO: LV EF: 60% -   65%   Neuro/Psych PSYCHIATRIC DISORDERS Anxiety Depression Hx of Bells Palsy, right side    GI/Hepatic Neg liver ROS,   Endo/Other  negative endocrine ROS  Renal/GU negative Renal ROS     Musculoskeletal  Left elbow degenerative joint disease   Abdominal   Peds  Hematology negative hematology ROS (+)   Anesthesia Other Findings Hyperlipidemia  Reproductive/Obstetrics                            Anesthesia Physical  Anesthesia Plan  ASA: II  Anesthesia Plan: General and Regional   Post-op Pain Management: GA combined w/ Regional for post-op pain   Induction: Intravenous  PONV Risk Score and Plan: 4 or greater and Midazolam, Dexamethasone and Ondansetron  Airway Management Planned: LMA  Additional Equipment:   Intra-op Plan:   Post-operative Plan: Extubation in OR  Informed Consent: I have reviewed the patients History and Physical, chart, labs and discussed the procedure including the risks, benefits and alternatives for the proposed anesthesia with the patient or authorized representative who has indicated his/her understanding and acceptance.   Dental advisory given  Plan Discussed with: CRNA  Anesthesia Plan Comments:        Anesthesia Quick Evaluation

## 2017-10-29 NOTE — Anesthesia Postprocedure Evaluation (Signed)
Anesthesia Post Note  Patient: [name]  Procedure(s) Performed: Left elbow hardware removal, tenolysis, total elbow arthroplasty with repair reconstruction as necessary (Left )     Patient location during evaluation: PACU Anesthesia Type: Regional Level of consciousness: awake and alert Pain management: pain level controlled Vital Signs Assessment: post-procedure vital signs reviewed and stable Respiratory status: spontaneous breathing, nonlabored ventilation, respiratory function stable and patient connected to nasal cannula oxygen Cardiovascular status: blood pressure returned to baseline and stable Postop Assessment: no apparent nausea or vomiting Anesthetic complications: no    Last Vitals:  Vitals:   10/29/17 1745 10/29/17 1752  BP:  (!) 105/53  Pulse: 81 82  Resp: 10 (!) 8  Temp:    SpO2: 98% 99%    Last Pain:  Vitals:   10/29/17 1737  TempSrc:   PainSc: [place] DAVID
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
## 2017-10-29 NOTE — Anesthesia Procedure Notes (Signed)
Anesthesia Regional Block: Supraclavicular block   Pre-Anesthetic Checklist: ,, timeout performed, Correct Patient, Correct Site, Correct Laterality, Correct Procedure,, site marked, risks and benefits discussed, Surgical consent,  Pre-op evaluation,  At surgeon's request and post-op pain management  Laterality: Left  Prep: chloraprep       Needles:  Injection technique: Single-shot  Needle Type: Echogenic Stimulator Needle     Needle Length: 9cm  Needle Gauge: 21     Additional Needles:   Procedures:,,,, ultrasound used (permanent image in chart),,,,  Narrative:  Start time: 10/29/2017 1:00 PM End time: 10/29/2017 1:10 PM Injection made incrementally with aspirations every 5 mL.  Performed by: Personally  Anesthesiologist: Murvin Natal, MD  Additional Notes: Functioning IV was confirmed and monitors were applied.  A 18mm 21ga Arrow echogenic stimulator needle was used. Sterile prep, hand hygiene and sterile gloves were used.  Negative aspiration and negative test dose prior to incremental administration of local anesthetic. The patient tolerated the procedure well.

## 2017-10-29 NOTE — Progress Notes (Signed)
Orthopedic Tech Progress Note Patient Details:  [name] 1950/12/22 [id number]  Ortho Devices Type of Ortho Device: Arm sling Ortho Device/Splint Interventions: Application   Post Interventions Patient Tolerated: Well Instructions Provided: Care of device   [place] Pink 10/29/2017, 5:55 PM

## 2017-10-29 NOTE — Progress Notes (Signed)
Pharmacy Antibiotic Note  [name] is a 65 y.o. female admitted on 10/29/2017 for left-total elbow arthroplasty to receive surgical prophylaxis.  Pharmacy has been consulted for Vancomycin dosing for 24 hours. Pre-operatively, patient received 1250mg  at 12:28PM today. WBC and SCr are within normal limits. Patient is afebrile.   Plan: Vancomycin 750 mg IV every 12 hours x24 hours per MD consult order Stop date on order.  Monitor renal function and clinical status.  Follow-up wound cultures.   Height: 5\' 6"  (167.6 cm) Weight: 153 lb 8 oz (69.6 kg) IBW/kg (Calculated) : 59.3  Temp (24hrs), Avg:98 F (36.7 C), Min:97.7 F (36.5 C), Max:98.5 F (36.9 C)  Recent Labs  Lab 10/27/17 1323  WBC 5.6  CREATININE 0.64    Estimated Creatinine Clearance: 63.9 mL/min (by C-G formula based on SCr of 0.64 mg/dL).    Allergies  Allergen Reactions  . Atorvastatin     REACTION: abdominal cramping  . Hydrocodone-Homatropine Other (See Comments)    Caused hyper state, agitation, and wild dreams   . Simvastatin     REACTION: abdominal cramping  . Penicillins Rash    Has patient had a PCN reaction causing immediate rash, facial/tongue/throat swelling, SOB or lightheadedness with hypotension: Yes Has patient had a PCN reaction causing severe rash involving mucus membranes or skin necrosis: No Has patient had a PCN reaction that required hospitalization: No Has patient had a PCN reaction occurring within the last 10 years: No If all of the above answers are "NO", then may proceed with Cephalosporin use.     Antimicrobials this admission: Vancomycin 12/6 >>12/7  Dose adjustments this admission:  Microbiology results: 12/6 Wound culture >> 12/6 Soft tissue culture >>  Thank you for allowing pharmacy to be a part of this patient's care.  Sloan Leiter, PharmD, BCPS, BCCCP Clinical Pharmacist Clinical phone 10/29/2017 until 11PM [phone] After hours, please call #[phone] 10/29/2017 8:07  PM

## 2017-10-29 NOTE — Op Note (Signed)
See full dictation#[id number] Kathy Gorum MD

## 2017-10-29 NOTE — Anesthesia Procedure Notes (Addendum)
Procedure Name: Intubation Date/Time: 10/29/2017 1:33 PM Performed by: Freddie Breech, CRNA Pre-anesthesia Checklist: Patient identified, Emergency Drugs available, Suction available and Patient being monitored Patient Re-evaluated:Patient Re-evaluated prior to induction Oxygen Delivery Method: Circle System Utilized Preoxygenation: Pre-oxygenation with 100% oxygen Induction Type: IV induction Ventilation: Mask ventilation without difficulty Laryngoscope Size: Glidescope and 4 Grade View: Grade III Tube type: Oral Tube size: 7.0 mm Number of attempts: 2 Airway Equipment and Method: Stylet,  Oral airway and Video-laryngoscopy Placement Confirmation: positive ETCO2 and breath sounds checked- equal and bilateral (ETT location confirmed with video laryngoscopy. ) Secured at: 21 cm Tube secured with: Tape Dental Injury: Teeth and Oropharynx as per pre-operative assessment
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
## 2017-10-30 ENCOUNTER — Encounter (HOSPITAL_COMMUNITY): Payer: Self-pay | Admitting: Orthopedic Surgery

## 2017-10-30 ENCOUNTER — Other Ambulatory Visit: Payer: Self-pay

## 2017-10-30 DIAGNOSIS — M19022 Primary osteoarthritis, left elbow: Secondary | ICD-10-CM | POA: Diagnosis not present

## 2017-10-30 MED ORDER — DIPHENHYDRAMINE HCL 25 MG PO CAPS
25.0000 mg | ORAL_CAPSULE | Freq: Every day | ORAL | Status: DC | PRN
  Administered 2017-10-30: 25 mg via ORAL
  Filled 2017-10-30: qty 1

## 2017-10-30 MED ORDER — DIPHENHYDRAMINE HCL 25 MG PO CAPS: 25.0000 mg | ORAL_CAPSULE | Freq: Four times a day (QID) | ORAL | Status: DC | PRN

## 2017-10-30 MED ORDER — VANCOMYCIN HCL IN DEXTROSE 750-5 MG/150ML-% IV SOLN
750.0000 mg | Freq: Two times a day (BID) | INTRAVENOUS | Status: DC
  Administered 2017-10-31: 750 mg via INTRAVENOUS
  Filled 2017-10-30 (×2): qty 150

## 2017-10-30 NOTE — Progress Notes (Signed)
PT Cancellation Note  Patient Details Name: [name] MRN: [id number] DOB: 19-Aug-1951   Cancelled Treatment:    Skilled PT evaluation not completed- OT has evaluated patient and has relayed that patient expresses and demonstrates no functional concerns that PT will be able to assist with. Thank you for the referral, PT signing off.   Deniece Ree PT, DPT, CBIS  Supplemental Physical Therapist [facility]

## 2017-10-30 NOTE — Progress Notes (Signed)
Pharmacy Antibiotic Note  [name] is a 66 y.o. female admitted on 10/29/2017 for left-total elbow arthroplasty to receive surgical prophylaxis.  Pharmacy has been consulted to extend Vancomycin dosing for 24 more hours, through 12/8. No new labs.  Plan: Vancomycin 750 mg IV every 12 hours x 24 hrs Stop date on order  Follow-up wound cultures  Height: 5\' 6"  (167.6 cm) Weight: 153 lb 8 oz (69.6 kg) IBW/kg (Calculated) : 59.3  Temp (24hrs), Avg:98.1 F (36.7 C), Min:97.7 F (36.5 C), Max:98.5 F (36.9 C)  Recent Labs  Lab 10/27/17 1323  WBC 5.6  CREATININE 0.64    Estimated Creatinine Clearance: 63.9 mL/min (by C-G formula based on SCr of 0.64 mg/dL).    Allergies  Allergen Reactions  . Atorvastatin     REACTION: abdominal cramping  . Hydrocodone-Homatropine Other (See Comments)    Caused hyper state, agitation, and wild dreams   . Simvastatin     REACTION: abdominal cramping  . Penicillins Rash    Has patient had a PCN reaction causing immediate rash, facial/tongue/throat swelling, SOB or lightheadedness with hypotension: Yes Has patient had a PCN reaction causing severe rash involving mucus membranes or skin necrosis: No Has patient had a PCN reaction that required hospitalization: No Has patient had a PCN reaction occurring within the last 10 years: No If all of the above answers are "NO", then may proceed with Cephalosporin use.     Antimicrobials this admission: Vancomycin 12/6 >>12/8  Dose adjustments this admission:  Microbiology results: 12/6 Wound culture >> ngtd 12/6 Soft tissue culture >> ngtd  Thank you for allowing pharmacy to be a part of this patient's care.  Renold Genta, PharmD, BCPS Clinical Pharmacist Phone for today - [facility] - [phone] 10/30/2017 2:23 PM

## 2017-10-30 NOTE — Progress Notes (Signed)
Subjective: 1 Day Post-Op Procedure(s) (LRB): Left elbow hardware removal, tenolysis, total elbow arthroplasty with repair reconstruction as necessary (Left) Patient reports pain as minimal at this juncture. She is tolerating by mouth pain medications well. She is having a slow turn of sensation and motor functioning status post her operative block. He is tolerating a regular diet. She denies nausea, vomiting, fever or chills.    Objective: Vital signs in last 24 hours: Temp:  [97.7 F (36.5 C)-98.5 F (36.9 C)] 98.5 F (36.9 C) (12/07 0327) Pulse Rate:  [74-88] 74 (12/07 1115) Resp:  [8-28] 18 (12/07 0327) BP: (94-116)/(41-70) 107/41 (12/07 1115) SpO2:  [94 %-99 %] 97 % (12/07 0327)  Intake/Output from previous day: 12/06 0701 - 12/07 0700 In: 1450 [I.V.:1300; IV Piggyback:150] Out: 550 [Urine:400; Blood:150] Intake/Output this shift: Total I/O In: 240 [P.O.:240] Out: 0  Results for orders placed or performed during the hospital encounter of 10/29/17  Aerobic/Anaerobic Culture (surgical/deep wound)     Status: None (Preliminary result)   Collection Time: 10/29/17  2:36 PM  Result Value Ref Range Status   Specimen Description TISSUE  Final   Special Requests LEFT ELBOW JOINT  Final   Gram Stain   Final    RARE WBC PRESENT, PREDOMINANTLY PMN NO ORGANISMS SEEN    Culture PENDING  Incomplete   Report Status PENDING  Incomplete  Aerobic/Anaerobic Culture (surgical/deep wound)     Status: None (Preliminary result)   Collection Time: 10/29/17  2:39 PM  Result Value Ref Range Status   Specimen Description WOUND  Final   Special Requests LEFT ELBOW  Final   Gram Stain   Final    RARE WBC PRESENT, PREDOMINANTLY PMN NO ORGANISMS SEEN    Culture PENDING  Incomplete   Report Status PENDING  Incomplete   No results for input(s): HGB in the last 72 hours. No results for input(s): WBC, RBC, HCT, PLT in the last 72 hours. No results for input(s): NA, K, CL, CO2, BUN, CREATININE,  GLUCOSE, CALCIUM in the last 72 hours. No results for input(s): LABPT, INR in the last 72 hours.  The patient is alert and oriented in no acute distress. The patient complains of pain in the affected upper extremity.  The patient is noted to have a normal HEENT exam. Lung fields show equal chest expansion and no shortness of breath. Abdomen exam is nontender without distention. Lower extremity examination does not show any fracture dislocation or blood clot symptoms. Pelvis is stable and the neck and back are stable and nontender. Examination of the left upper extremity shows her splint is clean, dry and intact. Radial ulnar and motor nerve functions are intact. She does have a slight diminished sensation about the median and ulnar nerve as the block is slightly still in effect. No signs of infection are present. Assessment/Plan: 1 Day Post-Op Procedure(s) (LRB): Left elbow hardware removal, tenolysis, total elbow arthroplasty with repair reconstruction as necessary (Left) We will continue diligent pain control and in addition continue her IV antibiotics in the form of vancomycin. We will plan on  discharge tomorrow morning. We have discussed with her all issues at length.  Thuan Tippett L 10/30/2017, 1:39 PM

## 2017-10-30 NOTE — Progress Notes (Signed)
Orthopedic Tech Progress Note Patient Details:  [name] [birth year] [id number]  Ortho Devices Type of Ortho Device: Arm sling Ortho Device/Splint Location: Left Arm.... pt had difficulty using arm sling because pt arm could not bend due to long arm splint.  Arm sling was modified to help better support left arm.  (rear/elbow section of arm sling was cut out to support long arm splint at 0 degrees.) Ortho Device/Splint Interventions: Application   Post Interventions Patient Tolerated: Well Instructions Provided: Care of device   Kristopher Oppenheim 10/30/2017, 3:30 PM
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
## 2017-10-30 NOTE — Evaluation (Signed)
Occupational Therapy Evaluation and Discharge Patient Details Name: [name] MRN: [id number] DOB: [birth year] Today's Date: 10/30/2017    History of Present Illness pt s/p extensive Total elbow arthroplasty   Clinical Impression   This 66 yo female s/p above presents to acute OT with all education completed, we will D/C from acute OT.   Follow Up Recommendations  No OT follow up    Equipment Recommendations  None recommended by OT       Precautions / Restrictions Precautions Precautions: None Restrictions Weight Bearing Restrictions: Yes LUE Weight Bearing: Non weight bearing      Mobility Bed Mobility Overal bed mobility: Modified Independent             General bed mobility comments: Has to hold onto her LUE with RUE  Transfers Overall transfer level: Modified independent                        ADL either performed or assessed with clinical judgement   ADL Overall ADL's : Needs assistance/impaired Eating/Feeding: Set up;Sitting   Grooming: Minimal assistance;Sitting   Upper Body Bathing: Moderate assistance;Sitting     Lower Body Bathing Details (indicate cue type and reason): Mod I sit<>stand Upper Body Dressing : Moderate assistance;Sitting Upper Body Dressing Details (indicate cue type and reason): Pt had already modified her top to be able to get her arm in the sleeve Lower Body Dressing: Moderate assistance Lower Body Dressing Details (indicate cue type and reason): Mod I sit<>stand Toilet Transfer: Modified Independent   Toileting- Clothing Manipulation and Hygiene: Moderate assistance Toileting - Clothing Manipulation Details (indicate cue type and reason): Mod I sit <>stand   Tub/Shower Transfer Details (indicate cue type and reason): for showering they have the perfect setup with walk in shower and hand held shower head. From prior surgery she and husband are very aware of how to cover arm for showering so that it remains dry    General ADL Comments: Pt has to hold her LUE with her RUE when up and about due to the postioning of it in splint there is not a sling that will support it     Vision Patient Visual Report: No change from baseline              Pertinent Vitals/Pain Pain Assessment: 0-10 Pain Score: 3  Pain Location: left elbow Pain Descriptors / Indicators: Aching;Sore;Heaviness Pain Intervention(s): Limited activity within patient's tolerance;Monitored during session;Repositioned;Ice applied     Hand Dominance Right   Extremity/Trunk Assessment Upper Extremity Assessment Upper Extremity Assessment: LUE deficits/detail LUE Deficits / Details: total elbow arthroplasty LUE Coordination: decreased fine motor;decreased gross motor           Communication Communication Communication: No difficulties   Cognition Arousal/Alertness: Awake/alert Behavior During Therapy: WFL for tasks assessed/performed Overall Cognitive Status: Within Functional Limits for tasks assessed                                        Exercises Other Exercises Other Exercises: Instructed pt in hand and shoulder exercises as well as positioning for edema control        Home Living Family/patient expects to be discharged to:: Private residence Living Arrangements: Spouse/significant other Available Help at Discharge: Family;Available 24 hours/day               Bathroom Shower/Tub: Hospital doctor  Toilet: Standard     Home Equipment: Shower seat - built in;Hand held shower head          Prior Functioning/Environment Level of Independence: Independent                 OT Problem List: Decreased strength;Impaired UE functional use;Pain;Decreased range of motion      OT Treatment/Interventions:      OT Goals(Current goals can be found in the care plan section) Acute Rehab OT Goals Patient Stated Goal: home in a couple of days  OT Frequency:                 AM-PAC PT "6 Clicks" Daily Activity     Outcome Measure Help from another person eating meals?: A Little Help from another person taking care of personal grooming?: A Little Help from another person toileting, which includes using toliet, bedpan, or urinal?: A Lot Help from another person bathing (including washing, rinsing, drying)?: A Lot Help from another person to put on and taking off regular upper body clothing?: A Lot Help from another person to put on and taking off regular lower body clothing?: A Lot 6 Click Score: 14   End of Session Nurse Communication: (pt is fine to get up with family)  Activity Tolerance: Patient tolerated treatment well Patient left: in chair;with call bell/phone within reach;with family/visitor present  OT Visit Diagnosis: Pain Pain - Right/Left: Left Pain - part of body: (elbow)                Time: 6789-3810 OT Time Calculation (min): 26 min Charges:  OT General Charges $OT Visit: 1 Visit OT Evaluation $OT Eval Moderate Complexity: 1 Mod OT Treatments $Self Care/Home Management : 8-22 mins G-Codes: OT G-codes **NOT FOR INPATIENT CLASS** Functional Limitation: Self care Self Care Current Status (F7510): At least 20 percent but less than 40 percent impaired, limited or restricted Self Care Goal Status (C5852): At least 20 percent but less than 40 percent impaired, limited or restricted Self Care Discharge Status [id number]): At least 20 percent but less than 40 percent impaired, limited or restricted   [address], OTR/L [phone] 10/30/2017

## 2017-10-31 DIAGNOSIS — M19022 Primary osteoarthritis, left elbow: Secondary | ICD-10-CM | POA: Diagnosis not present
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
## 2017-10-31 NOTE — Discharge Instructions (Signed)

## 2017-10-31 NOTE — Progress Notes (Signed)
Patient ID: [name], female   DOB: Oct 26, 1951, 66 y.o.   MRN: [id number] Doing well.  Patient is neurovascularly intact.  All looks well. We will DC her today.  All questions have been encouraged and answered  Ngoc Detjen M.D.

## 2017-11-03 LAB — AEROBIC/ANAEROBIC CULTURE (SURGICAL/DEEP WOUND): CULTURE: NO GROWTH

## 2017-11-03 LAB — AEROBIC/ANAEROBIC CULTURE W GRAM STAIN (SURGICAL/DEEP WOUND): Culture: NO GROWTH

## 2017-11-12 ENCOUNTER — Ambulatory Visit: Payer: Self-pay | Admitting: *Deleted

## 2017-11-12 ENCOUNTER — Ambulatory Visit: Payer: Medicare Other | Admitting: Internal Medicine

## 2017-11-12 ENCOUNTER — Encounter: Payer: Self-pay | Admitting: Internal Medicine

## 2017-11-12 VITALS — BP 118/62 | HR 104 | Temp 97.9°F | Wt 149.0 lb

## 2017-11-12 DIAGNOSIS — R42 Dizziness and giddiness: Secondary | ICD-10-CM

## 2017-11-12 MED ORDER — MECLIZINE HCL 25 MG PO TABS: 25.0000 mg | ORAL_TABLET | Freq: Three times a day (TID) | ORAL | 1 refills | Status: DC | PRN
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
## 2017-11-12 NOTE — Assessment & Plan Note (Signed)
Classic vestibular symptoms--mostly in bed Nothing to suggest brain stem infarct Discussed time course and canalith repositioning maneuvers Meclizine tid and wean Discussed holding or stopping the benedryl

## 2017-11-12 NOTE — Telephone Encounter (Signed)
Pt started having problems Tuesday night with severe dizziness. Had shoulder surgery on 12/07. Care advice given. Appointment made for today.  Reason for Disposition . [1] MODERATE dizziness (e.g., vertigo; feels very unsteady, interferes with normal activities) AND [2] has NOT been evaluated by physician for this  Answer Assessment - Initial Assessment Questions 1. DESCRIPTION: "Describe your dizziness."     Room spinnin 2. VERTIGO: "Do you feel like either you or the room is spinning or tilting?"      yes 3. LIGHTHEADED: "Do you feel lightheaded?" (e.g., somewhat faint, woozy, weak upon standing)     lightheaded 4. SEVERITY: "How bad is it?"  "Can you walk?"   - MILD - Feels unsteady but walking normally.   - MODERATE - Feels very unsteady when walking, but not falling; interferes with normal activities (e.g., school, work) .   - SEVERE - Unable to walk without falling (requires assistance).     moderate 5. ONSET:  "When did the dizziness begin?"     This past Tuesday morning 6. AGGRAVATING FACTORS: "Does anything make it worse?" (e.g., standing, change in head position)     Movement and getting in the bed 7. CAUSE: "What do you think is causing the dizziness?"     vertigo 8. RECURRENT SYMPTOM: "Have you had dizziness before?" If so, ask: "When was the last time?" "What happened that time?"     Perhaps only once or twice, had it before with migraines, a couple of years ago 9. OTHER SYMPTOMS: "Do you have any other symptoms?" (e.g., headache, weakness, numbness, vomiting, earache)     A wave of nausea Tuesday night, slight headache this time 10. PREGNANCY: "Is there any chance you are pregnant?" "When was your last menstrual period?"       n/a  Protocols used: DIZZINESS - VERTIGO-A-AH

## 2017-11-12 NOTE — Telephone Encounter (Signed)
Will evaluate at OV 

## 2017-11-12 NOTE — Telephone Encounter (Signed)
Pt has an appointment today with Dr Silvio Pate.

## 2017-11-23 ENCOUNTER — Other Ambulatory Visit: Payer: Self-pay | Admitting: Internal Medicine

## 2018-01-26 ENCOUNTER — Encounter: Payer: Medicare Other | Admitting: Internal Medicine

## 2018-03-01 ENCOUNTER — Encounter: Payer: Medicare Other | Admitting: Internal Medicine

## 2018-03-11 IMAGING — DX DG ELBOW 2V*L*
2 series · 2 of 2 positions shown · non-contrast
Comparison: LEFT elbow radiographs May 21, 2017

CLINICAL DATA: Status post joint replacement.

EXAM:
LEFT ELBOW - 2 VIEW

[elbow ap]
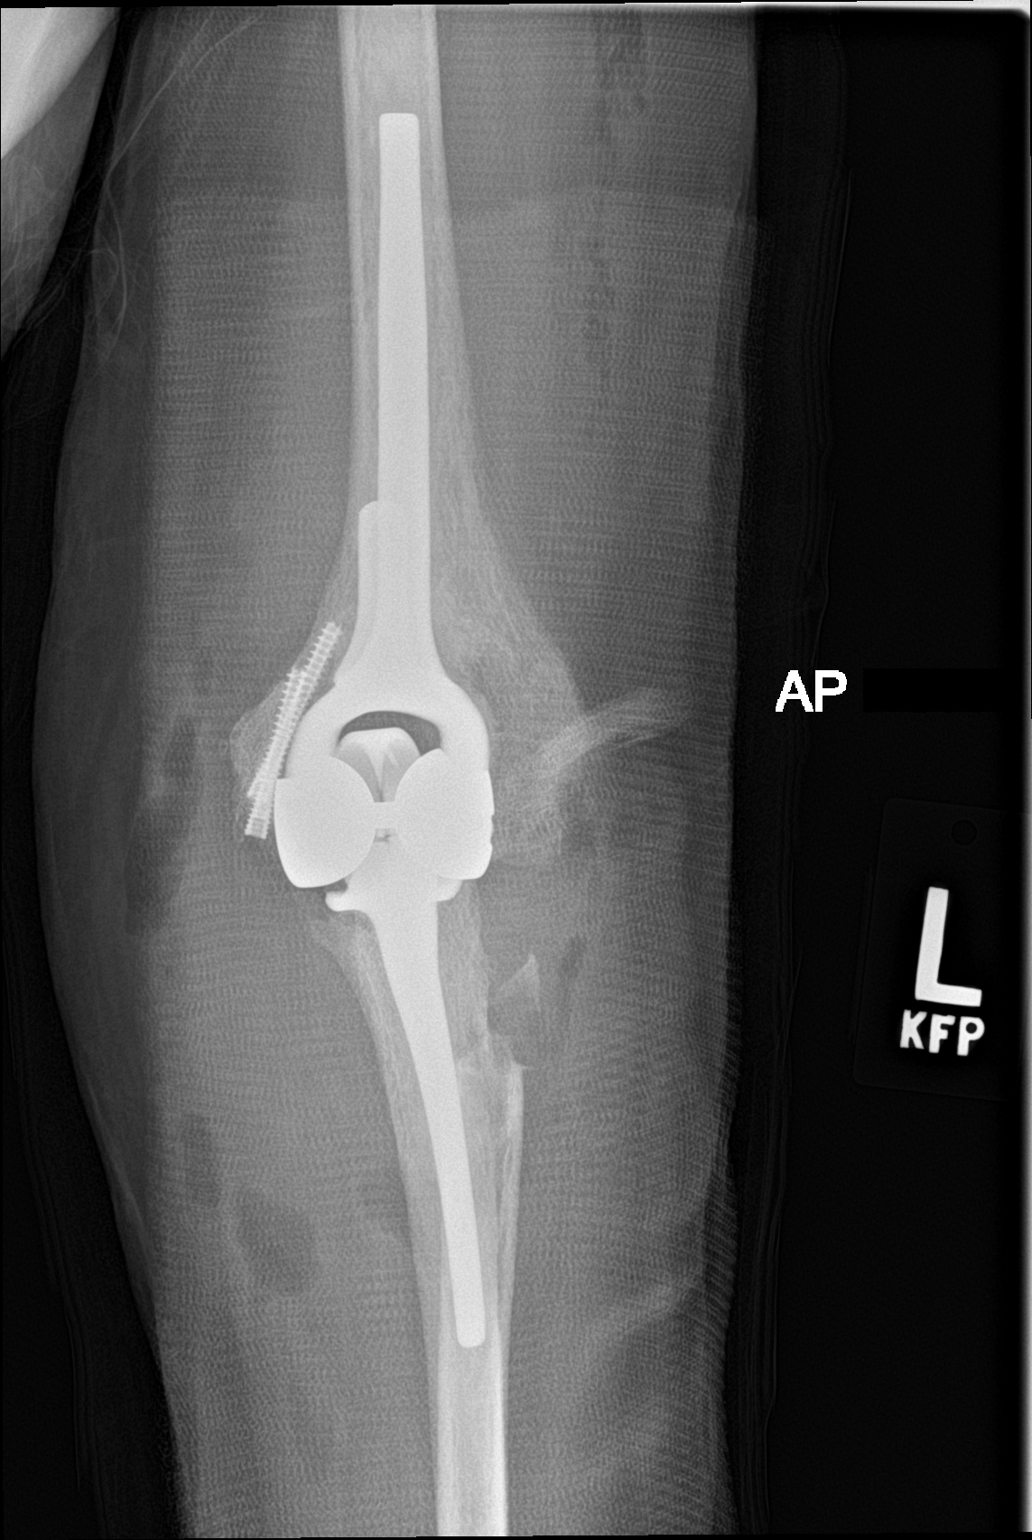

[elbow lat]
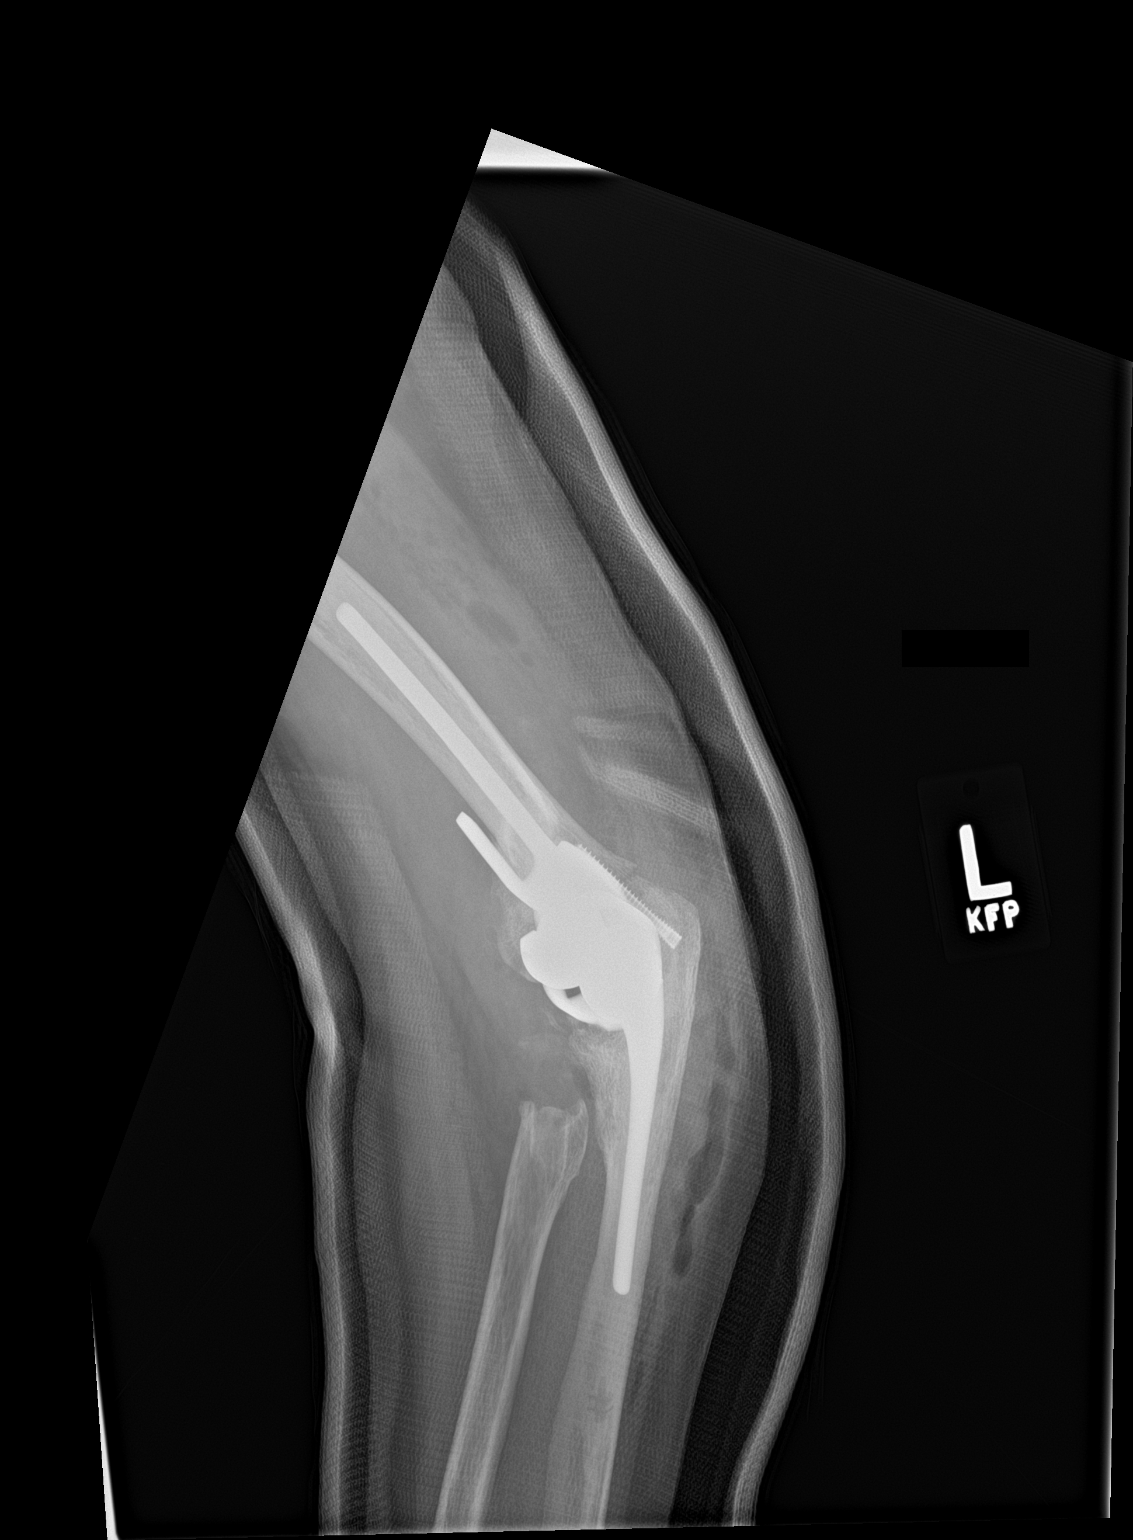

[2 of 2 positions shown; findings below may reference images not displayed]

FINDINGS: Interval mobile of radial head hardware. New elbow arthroplasty,
medial epicondylar interference screws. No destructive bony lesions.
Thick fiberglass cast obscures the fine bony detail. Soft tissue
swelling and subcutaneous gas consistent with recent surgery.
IMPRESSION: Postsurgical changes of LEFT elbow in fiberglass splint.

## 2018-04-21 ENCOUNTER — Other Ambulatory Visit: Payer: Self-pay | Admitting: Internal Medicine

## 2018-04-21 DIAGNOSIS — Z1231 Encounter for screening mammogram for malignant neoplasm of breast: Secondary | ICD-10-CM

## 2018-04-30 ENCOUNTER — Other Ambulatory Visit: Payer: Self-pay | Admitting: Internal Medicine

## 2018-05-05 ENCOUNTER — Encounter: Payer: Medicare Other | Admitting: Internal Medicine

## 2018-05-20 ENCOUNTER — Ambulatory Visit
Admission: RE | Admit: 2018-05-20 | Discharge: 2018-05-20 | Disposition: A | Discharge status: Home / Self Care | Payer: Medicare Other | Source: Ambulatory Visit | Attending: Internal Medicine | Admitting: Internal Medicine

## 2018-05-20 DIAGNOSIS — Z1231 Encounter for screening mammogram for malignant neoplasm of breast: Secondary | ICD-10-CM | POA: Insufficient documentation

## 2018-07-02 ENCOUNTER — Encounter: Payer: Self-pay | Admitting: Internal Medicine

## 2018-07-02 ENCOUNTER — Ambulatory Visit (INDEPENDENT_AMBULATORY_CARE_PROVIDER_SITE_OTHER): Payer: Medicare Other | Admitting: Internal Medicine

## 2018-07-02 VITALS — BP 122/62 | HR 79 | Temp 98.5°F | Ht 64.75 in | Wt 153.5 lb

## 2018-07-02 DIAGNOSIS — E785 Hyperlipidemia, unspecified: Secondary | ICD-10-CM | POA: Diagnosis not present

## 2018-07-02 DIAGNOSIS — M8580 Other specified disorders of bone density and structure, unspecified site: Secondary | ICD-10-CM

## 2018-07-02 DIAGNOSIS — L300 Nummular dermatitis: Secondary | ICD-10-CM

## 2018-07-02 DIAGNOSIS — Z7189 Other specified counseling: Secondary | ICD-10-CM

## 2018-07-02 DIAGNOSIS — Z0000 Encounter for general adult medical examination without abnormal findings: Secondary | ICD-10-CM | POA: Diagnosis not present

## 2018-07-02 DIAGNOSIS — I350 Nonrheumatic aortic (valve) stenosis: Secondary | ICD-10-CM

## 2018-07-02 DIAGNOSIS — F39 Unspecified mood [affective] disorder: Secondary | ICD-10-CM
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
## 2018-07-02 NOTE — Assessment & Plan Note (Signed)
Plan repeat DEXA 70-75

## 2018-07-02 NOTE — Assessment & Plan Note (Signed)
See social history 

## 2018-07-02 NOTE — Progress Notes (Signed)
Hearing Screening   125Hz  250Hz  500Hz  1000Hz  2000Hz  3000Hz  4000Hz  6000Hz  8000Hz   Right ear:   20 25 20  20     Left ear:   20 25 20  20     Vision Screening Comments: Completed at [facility] 03/2018

## 2018-07-02 NOTE — Assessment & Plan Note (Signed)
I have personally reviewed the Medicare Annual Wellness questionnaire and have noted 1. The patient's medical and social history 2. Their use of alcohol, tobacco or illicit drugs 3. Their current medications and supplements 4. The patient's functional ability including ADL's, fall risks, home safety risks and hearing or visual             impairment. 5. Diet and physical activities 6. Evidence for depression or mood disorders  The patients weight, height, BMI and visual acuity have been recorded in the chart I have made referrals, counseling and provided education to the patient based review of the above and I have provided the pt with a written personalized care plan for preventive services.  I have provided you with a copy of your personalized plan for preventive services. Please take the time to review along with your updated medication list.  Yearly flu vaccine Is on waiting list for shingrix Colon due 2028 Just had mammogram-- every 2 years Working on fitness

## 2018-07-02 NOTE — Assessment & Plan Note (Signed)
Chronic dysthymia and mild anxiety Will continue medication

## 2018-07-02 NOTE — Assessment & Plan Note (Signed)
No symptoms Didn't really hear murmur today No action

## 2018-07-02 NOTE — Assessment & Plan Note (Signed)
Failed 2 statins Will recheck next year---benefiber has helped

## 2018-07-02 NOTE — Assessment & Plan Note (Signed)
Complicated topical regimen

## 2018-07-13 ENCOUNTER — Other Ambulatory Visit: Payer: Self-pay | Admitting: Internal Medicine

## 2018-08-12 ENCOUNTER — Ambulatory Visit: Payer: Medicare Other

## 2019-02-16 ENCOUNTER — Encounter: Payer: Self-pay | Admitting: Internal Medicine

## 2019-02-16 ENCOUNTER — Other Ambulatory Visit: Payer: Self-pay

## 2019-02-16 ENCOUNTER — Telehealth (INDEPENDENT_AMBULATORY_CARE_PROVIDER_SITE_OTHER): Payer: Medicare Other | Admitting: Internal Medicine

## 2019-02-16 DIAGNOSIS — J069 Acute upper respiratory infection, unspecified: Secondary | ICD-10-CM

## 2019-02-16 MED ORDER — BENZONATATE 200 MG PO CAPS: 200.0000 mg | ORAL_CAPSULE | Freq: Three times a day (TID) | ORAL | 0 refills | Status: DC | PRN

## 2019-02-16 NOTE — Progress Notes (Signed)
Virtual Visit via Telephone Note  I connected with [name] on 02/16/19 at 10:40 by telephone and verified that I am speaking with the correct person using two identifiers.   I discussed the limitations, risks, security and privacy concerns of performing an evaluation and management service by telephone and the availability of in person appointments. I also discussed with the patient that there may be a patient responsible charge related to this service. The patient expressed understanding and agreed to proceed.   History of Present Illness: Confirmed her DOB, etc She is at home and I am in my office  Mild respiratory symptoms started about 8 days ago Some runny nose Mild head congestion Dry cough---some clear, watery saliva Tried mucinex DM and sudafed Cough is keeping her up at night---feels deep in the chest At night, she can "hear the bubbling in my chest" No SOB and just can't bring anything up  No ear pain or sore throat No recent travel or known infection exposure Does have allergy issues----stays on nasacort year round Hasn't used any antihistamine   Observations/Objective: Normal speech No cough during the visit No dyspnea apparent  Assessment and Plan: Viral URI---see problem list  Follow Up Instructions:    I discussed the assessment and treatment plan with the patient. The patient was provided an opportunity to ask questions and all were answered. The patient agreed with the plan and demonstrated an understanding of the instructions.   The patient was advised to call back or seek an in-person evaluation if the symptoms worsen or if the condition fails to improve as anticipated.  I provided 11 minutes of non-face-to-face time during this encounter.   Viviana Simpler, MD

## 2019-02-16 NOTE — Assessment & Plan Note (Signed)
Unclear if this is just viral infection or allergies---or both. No evidence of secondary bacterial sinus infection. We discussed the symptoms of this and she will call back if they occur. Discussed honey, Rx for tessalon and sleeping more upright for the troubling cough Continue nasacort and add OTC antihistamine

## 2019-02-16 NOTE — Evaluation (Signed)
Spoke to pt to verify her allergies and med list. She is aware of the charge for the visit.

## 2019-05-20 ENCOUNTER — Other Ambulatory Visit: Payer: Self-pay | Admitting: Internal Medicine

## 2019-07-04 ENCOUNTER — Other Ambulatory Visit: Payer: Self-pay

## 2019-07-04 ENCOUNTER — Encounter: Payer: Self-pay | Admitting: Internal Medicine

## 2019-07-04 ENCOUNTER — Ambulatory Visit (INDEPENDENT_AMBULATORY_CARE_PROVIDER_SITE_OTHER): Payer: Medicare Other | Admitting: Internal Medicine

## 2019-07-04 VITALS — BP 132/88 | HR 78 | Temp 98.2°F | Ht 64.5 in | Wt 153.0 lb

## 2019-07-04 DIAGNOSIS — Z0001 Encounter for general adult medical examination with abnormal findings: Secondary | ICD-10-CM | POA: Diagnosis not present

## 2019-07-04 DIAGNOSIS — M8580 Other specified disorders of bone density and structure, unspecified site: Secondary | ICD-10-CM

## 2019-07-04 DIAGNOSIS — E785 Hyperlipidemia, unspecified: Secondary | ICD-10-CM | POA: Diagnosis not present

## 2019-07-04 DIAGNOSIS — K219 Gastro-esophageal reflux disease without esophagitis: Secondary | ICD-10-CM | POA: Diagnosis not present

## 2019-07-04 DIAGNOSIS — I700 Atherosclerosis of aorta: Secondary | ICD-10-CM | POA: Diagnosis not present

## 2019-07-04 DIAGNOSIS — F39 Unspecified mood [affective] disorder: Secondary | ICD-10-CM

## 2019-07-04 DIAGNOSIS — Z0000 Encounter for general adult medical examination without abnormal findings: Secondary | ICD-10-CM

## 2019-07-04 DIAGNOSIS — Z7189 Other specified counseling: Secondary | ICD-10-CM

## 2019-07-04 LAB — LIPID PANEL
Cholesterol: 309 mg/dL — ABNORMAL HIGH (ref 0–200)
HDL: 46.1 mg/dL (ref >39.00)
NonHDL: 262.58
Total CHOL/HDL Ratio: 7
Triglycerides: 253 mg/dL — ABNORMAL HIGH (ref 0.0–149.0)
VLDL: 50.6 mg/dL — ABNORMAL HIGH (ref 0.0–40.0)

## 2019-07-04 LAB — COMPREHENSIVE METABOLIC PANEL
ALT: 24 U/L (ref 0–35)
AST: 20 U/L (ref 0–37)
Albumin: 4.5 g/dL (ref 3.5–5.2)
Alkaline Phosphatase: 63 U/L (ref 39–117)
BUN: 19 mg/dL (ref 6–23)
CO2: 31 mEq/L (ref 19–32)
Calcium: 11.3 mg/dL — ABNORMAL HIGH (ref 8.4–10.5)
Chloride: 100 mEq/L (ref 96–112)
Creatinine, Ser: 0.71 mg/dL (ref 0.40–1.20)
GFR: 81.51 mL/min (ref >60.00)
Glucose, Bld: 95 mg/dL (ref 70–99)
Potassium: 4.7 mEq/L (ref 3.5–5.1)
Sodium: 138 mEq/L (ref 135–145)
Total Bilirubin: 0.4 mg/dL (ref 0.2–1.2)
Total Protein: 7.5 g/dL (ref 6.0–8.3)

## 2019-07-04 LAB — CBC
HCT: 41.7 % (ref 36.0–46.0)
Hemoglobin: 14.1 g/dL (ref 12.0–15.0)
MCHC: 33.8 g/dL (ref 30.0–36.0)
MCV: 96 fl (ref 78.0–100.0)
Platelets: 273 10*3/uL (ref 150.0–400.0)
RBC: 4.34 Mil/uL (ref 3.87–5.11)
RDW: 13.4 % (ref 11.5–15.5)
WBC: 6.2 10*3/uL (ref 4.0–10.5)

## 2019-07-04 LAB — LDL CHOLESTEROL, DIRECT: Direct LDL: 230 mg/dL

## 2019-07-04 NOTE — Patient Instructions (Signed)
You can consider trying melatonin--- 3mg  or even up to 6mg  nightly--to help your sleep.

## 2019-07-04 NOTE — Assessment & Plan Note (Signed)
Calcium/vitamin D Weight bearing exercise Recheck DEXA next year

## 2019-07-04 NOTE — Assessment & Plan Note (Signed)
See social history 

## 2019-07-04 NOTE — Assessment & Plan Note (Signed)
Seen on CXR No vascular symptoms Has not tolerated statins

## 2019-07-04 NOTE — Assessment & Plan Note (Signed)
Better ?Uses tums prn ?

## 2019-07-04 NOTE — Assessment & Plan Note (Signed)
Will recheck but no Rx unless vascular event. Mom had high cholesterol but no real vascular history
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
## 2019-07-04 NOTE — Progress Notes (Signed)
Hearing Screening   Method: Audiometry   125Hz  250Hz  500Hz  1000Hz  2000Hz  3000Hz  4000Hz  6000Hz  8000Hz   Right ear:   20 20 20  20     Left ear:   20 20 20  20       Visual Acuity Screening   Right eye Left eye Both eyes  Without correction:     With correction: 20/15 20/15 20/13

## 2019-07-04 NOTE — Assessment & Plan Note (Signed)
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

## 2019-07-04 NOTE — Assessment & Plan Note (Signed)
I have personally reviewed the Medicare Annual Wellness questionnaire and have noted 1. The patient's medical and social history 2. Their use of alcohol, tobacco or illicit drugs 3. Their current medications and supplements 4. The patient's functional ability including ADL's, fall risks, home safety risks and hearing or visual             impairment. 5. Diet and physical activities 6. Evidence for depression or mood disorders  The patients weight, height, BMI and visual acuity have been recorded in the chart I have made referrals, counseling and provided education to the patient based review of the above and I have provided the pt with a written personalized care plan for preventive services.  I have provided you with a copy of your personalized plan for preventive services. Please take the time to review along with your updated medication list.  Colon due 2028 Mammogram due 6/21 No pap due to age Discussed increasing exercise Flu vaccine 1-2 months

## 2019-09-12 ENCOUNTER — Telehealth: Payer: Self-pay | Admitting: *Deleted

## 2019-09-12 NOTE — Telephone Encounter (Signed)
Let her know that there is a good chance she is having a reaction to the ant spray (he may want to limit the spraying to around the outside foundation in the future).  If she has no mouth swelling or SOB, okay to hold off on appointment. I would recommend cetirizine 10mg  or fexofenadine 180mg  once or twice a day (benedryl is too short acting though she can still use it at bedtime if needed) till the reaction settles down

## 2019-09-12 NOTE — Telephone Encounter (Signed)
Spoke to pt. She stated she understood. She said she will call us later in the week if she is not getting any better.

## 2019-09-12 NOTE — Telephone Encounter (Signed)
Patient called stating that she started Thursday morning with hives. Patient stated that she has been taking benadryl and using hydrocortisone cream to treat the symptoms. Patient stated that the problem seems to be worse in the mornings and seems to get better later on in the day. Patient stated that her hives vary in size from pea size to small peach size. Patient stated that she has not changed any her soaps, detergents or lotions. Patient stated that she has had both of her shingles vaccines. Patient stated that the hives itch at times. Patient stated that her husband did stray some ant spray last week, but was safe for inside usage. Patient stated that she has never had this problem before.  Patient wants to know what Dr. Silvio Pate thinks and should she be seen? Patient stated that the hives seem to appear all over her body. Pharmacy [facility]

## 2019-09-19 ENCOUNTER — Telehealth: Payer: Self-pay

## 2019-09-19 NOTE — Telephone Encounter (Signed)
Probably best to come in---I can make referral to allergist if indicated also

## 2019-09-19 NOTE — Telephone Encounter (Signed)
Left detailed message on Vm per DPR °

## 2019-09-19 NOTE — Telephone Encounter (Signed)
Patient called with update on her hives. (see original telephone note on 09/12/2019) patient states she is still having hives, very itchy, No pustules or blisters like shingles or anything. She is still taking anti histamine as directed and applying hydrocortisone. Any other suggestions or does patient need to come in to be seen at this point?

## 2019-09-20 ENCOUNTER — Other Ambulatory Visit: Payer: Self-pay

## 2019-09-20 ENCOUNTER — Encounter: Payer: Self-pay | Admitting: Family Medicine

## 2019-09-20 ENCOUNTER — Ambulatory Visit (INDEPENDENT_AMBULATORY_CARE_PROVIDER_SITE_OTHER): Payer: Medicare Other | Admitting: Family Medicine

## 2019-09-20 VITALS — BP 118/76 | HR 96 | Temp 98.7°F | Ht 64.5 in | Wt 153.8 lb

## 2019-09-20 DIAGNOSIS — L509 Urticaria, unspecified: Secondary | ICD-10-CM | POA: Diagnosis not present

## 2019-09-20 NOTE — Patient Instructions (Addendum)
Hives - avoid: aspirin, ibuprofen, naproxen - consider stopping your garlic supplement to see if that helps  Medication:  1) Zyrtec 10 mg twice daily 2) Continue hydrocortisone cream as needed  Call or MyChart next week if no improvement and wanting to try steroid  Should resolve within 6 weeks  If still present in 2 weeks >> then let me know and we can plan for referral to allergy testing

## 2019-09-20 NOTE — Assessment & Plan Note (Signed)
Discussed that it could take 6 weeks to fully resolve.

## 2019-09-20 NOTE — Progress Notes (Signed)
   Subjective:     [name] is a 68 y.o. female presenting for Urticaria (almost 2 weeks now. Has taking anti histamine and hydrocortisone cream)     HPI   #Hives - started 2 weeks ago - using hydrocortisone cream and antihistamine - worse in the AM - improves midday - and does not completely go away - from knees to shoulders - also has rosea - itchy - helpful but not resolving completely - no hx of symptoms in the past - no diet change - medication change from fish oil to garlic tabs 6 weeks ago - location: abdomen, shoulder, arm, legs, buttocks   Review of Systems  Constitutional: Negative for chills and fever.  Respiratory: Negative for shortness of breath, wheezing and stridor.   Gastrointestinal: Negative for constipation and diarrhea.  Skin: Positive for rash.     Social History   Tobacco Use  Smoking Status Never Smoker  Smokeless Tobacco Never Used        Objective:    BP Readings from Last 3 Encounters:  09/20/19 118/76  07/04/19 132/88  07/02/18 122/62   Wt Readings from Last 3 Encounters:  09/20/19 153 lb 12 oz (69.7 kg)  07/04/19 153 lb (69.4 kg)  07/02/18 153 lb 8 oz (69.6 kg)    BP 118/76   Pulse 96   Temp 98.7 F (37.1 C)   Ht 5' 4.5" (1.638 m)   Wt 153 lb 12 oz (69.7 kg)   SpO2 96%   BMI 25.98 kg/m    Physical Exam Constitutional:      General: She is not in acute distress.    Appearance: She is well-developed. She is not diaphoretic.  HENT:     Right Ear: External ear normal.     Left Ear: External ear normal.     Nose: Nose normal.  Eyes:     Conjunctiva/sclera: Conjunctivae normal.  Neck:     Musculoskeletal: Neck supple.  Cardiovascular:     Rate and Rhythm: Normal rate.  Pulmonary:     Effort: Pulmonary effort is normal.  Skin:    General: Skin is warm and dry.     Capillary Refill: Capillary refill takes less than 2 seconds.     Comments: Diffuse raised erythematous wheels on the torso, legs, arms. No  ulcers or vesicles  Neurological:     Mental Status: She is alert. Mental status is at baseline.  Psychiatric:        Mood and Affect: Mood normal.        Behavior: Behavior normal.           Assessment & Plan:   Problem List Items Addressed This Visit      Musculoskeletal and Integument   Urticaria - Primary    Discussed that it could take 6 weeks to fully resolve.         Patient Instructions  Hives - avoid: aspirin, ibuprofen, naproxen - consider stopping your garlic supplement to see if that helps  Medication:  1) Zyrtec 10 mg twice daily 2) Continue hydrocortisone cream as needed  Call or MyChart next week if no improvement and wanting to try steroid  Should resolve within 6 weeks  If still present in 2 weeks >> then let me know and we can plan for referral to allergy testing     Return if symptoms worsen or fail to improve.  Lesleigh Noe, MD

## 2019-09-21 ENCOUNTER — Ambulatory Visit: Payer: Medicare Other | Admitting: Family Medicine

## 2019-09-30 ENCOUNTER — Telehealth: Payer: Self-pay

## 2019-09-30 MED ORDER — PREDNISONE 20 MG PO TABS: 40.0000 mg | ORAL_TABLET | Freq: Every day | ORAL | 0 refills | Status: DC

## 2019-09-30 NOTE — Telephone Encounter (Signed)
Patient advised and verbalized understanding 

## 2019-09-30 NOTE — Telephone Encounter (Signed)
Please let her know that I ddi send prescription for prednisone. If that doesn't help, or the hives return, she should probably set up to see allergist ([facility] in [place] would be a good place).  Advise her that increased appetite and less sleep are common side effects of prednisone

## 2019-09-30 NOTE — Telephone Encounter (Signed)
Patient left message on triage line stating her hives/whelps are not better. She is using anti histamine and doing everything she was advised of. This is week 3 of having break out/whelps daily, even starting to wake her up now with itching and discomfort. She would like to know if she can try prednisone or something else at this time to help more than what she has been doing. Please advise. Thank you (saw Dr Einar Pheasant on 09/20/2019)

## 2019-11-28 ENCOUNTER — Other Ambulatory Visit: Payer: Self-pay

## 2019-11-28 ENCOUNTER — Ambulatory Visit: Payer: Medicare Other | Admitting: Internal Medicine

## 2019-11-28 ENCOUNTER — Encounter: Payer: Self-pay | Admitting: Internal Medicine

## 2019-11-28 DIAGNOSIS — L509 Urticaria, unspecified: Secondary | ICD-10-CM

## 2019-11-28 NOTE — Assessment & Plan Note (Signed)
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 Okay benedryl--but would need to consider stopping Will refer for allergy evaluation
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
## 2019-12-01 ENCOUNTER — Telehealth: Payer: Self-pay

## 2019-12-01 NOTE — Telephone Encounter (Signed)
Patient called checking on referral for allergist specialist. Please call when possible. She wants [facility] in [place] and anytime except not early early. Thank you

## 2019-12-02 NOTE — Telephone Encounter (Signed)
Pt scheduled@[facility]  Allergy 12-15-19. Pt aware.

## 2020-01-16 ENCOUNTER — Ambulatory Visit: Payer: Medicare Other | Attending: Internal Medicine

## 2020-01-16 DIAGNOSIS — Z23 Encounter for immunization: Secondary | ICD-10-CM | POA: Insufficient documentation

## 2020-01-16 NOTE — Progress Notes (Signed)
   Covid-19 Vaccination Clinic  Name:  [name]    MRN: [id number] DOB: 02/22/1951  01/16/2020  Ms. [name] was observed post Covid-19 immunization for 15 minutes without incidence. She was provided with Vaccine Information Sheet and instruction to access the V-Safe system.   Ms. [name] was instructed to call 911 with any severe reactions post vaccine: Marland Kitchen Difficulty breathing  . Swelling of your face and throat  . A fast heartbeat  . A bad rash all over your body  . Dizziness and weakness    Immunizations Administered    Name Date Dose VIS Date Route   Pfizer COVID-19 Vaccine 01/16/2020  8:59 AM 0.3 mL 11/04/2019 Intramuscular   Manufacturer: [place]   Lot: [id number]   [place]: [id number]

## 2020-01-17 ENCOUNTER — Telehealth: Payer: Self-pay | Admitting: *Deleted

## 2020-01-17 ENCOUNTER — Other Ambulatory Visit: Payer: Self-pay

## 2020-01-17 ENCOUNTER — Encounter: Payer: Self-pay | Admitting: Emergency Medicine

## 2020-01-17 ENCOUNTER — Emergency Department
Admission: EM | Admit: 2020-01-17 | Discharge: 2020-01-17 | Disposition: A | Discharge status: Home / Self Care | Payer: Medicare Other | Attending: Emergency Medicine | Admitting: Emergency Medicine

## 2020-01-17 DIAGNOSIS — T783XXA Angioneurotic edema, initial encounter: Secondary | ICD-10-CM | POA: Diagnosis not present

## 2020-01-17 DIAGNOSIS — M7989 Other specified soft tissue disorders: Secondary | ICD-10-CM | POA: Diagnosis not present

## 2020-01-17 DIAGNOSIS — Z79899 Other long term (current) drug therapy: Secondary | ICD-10-CM | POA: Diagnosis not present

## 2020-01-17 DIAGNOSIS — T7840XA Allergy, unspecified, initial encounter: Secondary | ICD-10-CM | POA: Diagnosis present

## 2020-01-17 MED ORDER — DIPHENHYDRAMINE HCL 50 MG/ML IJ SOLN
INTRAMUSCULAR | Status: AC
  Administered 2020-01-17: 50 mg via INTRAVENOUS
  Filled 2020-01-17: qty 1

## 2020-01-17 MED ORDER — PREDNISONE 20 MG PO TABS: 60.0000 mg | ORAL_TABLET | Freq: Every day | ORAL | 0 refills | Status: AC

## 2020-01-17 MED ORDER — EPINEPHRINE 0.3 MG/0.3ML IJ SOAJ: 0.3000 mg | INTRAMUSCULAR | 1 refills | Status: DC | PRN

## 2020-01-17 MED ORDER — FAMOTIDINE IN NACL 20-0.9 MG/50ML-% IV SOLN
20.0000 mg | Freq: Once | INTRAVENOUS | Status: AC
  Administered 2020-01-17: 20 mg via INTRAVENOUS
  Filled 2020-01-17: qty 50

## 2020-01-17 MED ORDER — METHYLPREDNISOLONE SODIUM SUCC 125 MG IJ SOLR
INTRAMUSCULAR | Status: AC
  Filled 2020-01-17: qty 2

## 2020-01-17 MED ORDER — DIPHENHYDRAMINE HCL 50 MG/ML IJ SOLN: 50.0000 mg | Freq: Once | INTRAMUSCULAR | Status: AC

## 2020-01-17 MED ORDER — EPINEPHRINE 0.3 MG/0.3ML IJ SOAJ: 0.3000 mg | Freq: Once | INTRAMUSCULAR | Status: DC

## 2020-01-17 MED ORDER — EPINEPHRINE 0.3 MG/0.3ML IJ SOAJ
INTRAMUSCULAR | Status: AC
  Filled 2020-01-17: qty 0.3

## 2020-01-17 MED ORDER — METHYLPREDNISOLONE SODIUM SUCC 125 MG IJ SOLR
125.0000 mg | Freq: Once | INTRAMUSCULAR | Status: AC
  Administered 2020-01-17: 125 mg via INTRAVENOUS
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
## 2020-01-17 NOTE — ED Notes (Signed)
Decrease in swelling to pts lips but pt reports feeling a soreness in her neck and head. Pt not feeling back to baseline. MD at bedside.

## 2020-01-17 NOTE — Telephone Encounter (Signed)
Patient left a voicemail stating that she had her first covid vaccine yesterday and did fine during the day. Patient stated that she woke up during the night and her face was swollen. Patient stated that she was not having any problems breathing. Patient stated that she went to the ER and was treated. Patient stated that she was given an EPI pen and Prednisone. Patient stated that she was told at the ER that she needed to report this to her PCP and that is what she is doing. Patient stated no need to call her back unless you have questions.

## 2020-01-17 NOTE — ED Provider Notes (Signed)
Patient is in no acute distress, still has some mild left facial swelling and left sided upper lip swelling.  She be encouraged to continue Benadryl and prednisone.  She is encouraged not to get the second vaccination.  She is cleared for outpatient follow-up.   Earleen Newport, MD 01/17/20 [phone]

## 2020-01-17 NOTE — ED Triage Notes (Addendum)
Patient ambulatory to triage with steady gait, without difficulty or distress noted, mask in place; pt reports recent hives, has been taking antihistamines (last ds benadryl at 9pm); awoke PTA with facial/lip swelling; received 1st COVID vaccine at 9am yesterday

## 2020-01-17 NOTE — ED Notes (Signed)
Slight swelling noted around left orbital and left upper lip. Pt speaking clear. Pt denies vision/speech issues at this time. NAD noted

## 2020-01-17 NOTE — ED Notes (Signed)
Swelling to lips but none noted to tongue. No changes in voice or reports of throat swelling or scratchiness.

## 2020-01-18 NOTE — Telephone Encounter (Signed)
Spoke to pt. She is improving.

## 2020-01-18 NOTE — Telephone Encounter (Signed)
On review of ER note--they have recommended that she not get the second vaccine. I would go along with that since they obviously had concerns about how she presented. Just check on her and make sure she is improving

## 2020-01-18 NOTE — Telephone Encounter (Signed)
Noted Please let her know that this is not clearly a reason not to get the second COVID vaccine---but I would be willing to give her a few days of prednisone before the vaccine to reduce the risk of another reaction (it would also be reasonable to forgo the second vaccine if she is more comfortable with this)

## 2020-02-08 ENCOUNTER — Ambulatory Visit: Payer: Medicare Other

## 2020-02-14 ENCOUNTER — Telehealth: Payer: Self-pay

## 2020-02-14 NOTE — Telephone Encounter (Signed)
Discussed above message with pt

## 2020-02-14 NOTE — Telephone Encounter (Signed)
Pt called wondering if she can stop her antihistamines she has been taking for hives, 3 weeks ago Dr Raliegh Ip decreased her antihistamines and she has not had any flares since, can she decrease any more, she is currently taking Famotidine QAM, Levocetirizine QAM, and Montelukast QHS

## 2020-02-21 ENCOUNTER — Telehealth: Payer: Self-pay

## 2020-02-21 NOTE — Telephone Encounter (Signed)
Pt said she is already scheduled for colonoscopy in Mid May 2021 and she has had hemorrhoids that bother pt at times. Pt wants to know if possible she could have something done about her hemorrhoids when has colonoscopy. Pt request cb after reviewed by Dr Silvio Pate.

## 2020-02-22 NOTE — Telephone Encounter (Signed)
Please let her know that some of the doctors do band hemorrhoids---but there is no chance it could be done at the same time as the colonoscopy. She would need to be seen in the office separately for this

## 2020-02-22 NOTE — Telephone Encounter (Signed)
Spoke to pt

## 2020-03-05 ENCOUNTER — Encounter: Payer: Self-pay | Admitting: Dermatology

## 2020-03-05 ENCOUNTER — Ambulatory Visit: Payer: Medicare Other | Admitting: Dermatology

## 2020-03-05 ENCOUNTER — Other Ambulatory Visit: Payer: Self-pay

## 2020-03-05 DIAGNOSIS — L509 Urticaria, unspecified: Secondary | ICD-10-CM | POA: Diagnosis not present

## 2020-03-05 DIAGNOSIS — L820 Inflamed seborrheic keratosis: Secondary | ICD-10-CM

## 2020-03-05 DIAGNOSIS — L719 Rosacea, unspecified: Secondary | ICD-10-CM

## 2020-03-05 DIAGNOSIS — L239 Allergic contact dermatitis, unspecified cause: Secondary | ICD-10-CM

## 2020-03-05 NOTE — Progress Notes (Signed)
   Follow-Up Visit   Subjective  [name] is a 69 y.o. female who presents for the following: Follow-up (Urticaria follow up - no flares since March 1st. Only taking Claritin for seasonal allergies now.).  She also has a spot on her lip that is growing and irritating and she would like that treated.  The following portions of the chart were reviewed this encounter and updated as appropriate: Tobacco  Allergies  Meds  Problems  Med Hx  Surg Hx  Fam Hx      Review of Systems: No other skin or systemic complaints.  Objective  Well appearing patient in no apparent distress; mood and affect are within normal limits.  A focused examination was performed including face. Relevant physical exam findings are noted in the Assessment and Plan.  Objective  Right Upper Cutaneous Lip: Erythematous keratotic or waxy stuck-on papule or plaque.   Objective  Mid Back: Clear today.  Assessment & Plan  Allergic dermatitis Mid Back  Allergic contact dermatitis - positive reactions to Gold, Cobalt and Isotiazolinone.  Inflamed seborrheic keratosis Right Upper Cutaneous Lip  Destruction of lesion - Right Upper Cutaneous Lip Complexity: simple   Destruction method: cryotherapy   Informed consent: discussed and consent obtained   Timeout:  patient name, date of birth, surgical site, and procedure verified Lesion destroyed using liquid nitrogen: Yes   Region frozen until ice ball extended beyond lesion: Yes   Outcome: patient tolerated procedure well with no complications   Post-procedure details: wound care instructions given    Rosacea Face  Metronidazole 1%/Azelaic Acid 15%/Ivermectin 1% cream bid. 30g 3RF - sent to [facility]  Urticaria Mid Back  Clear today. Observe for recurrence.  Return in about 3 months (around 06/04/2020).   I, Ashok Cordia, CMA, am acting as scribe for Sarina Ser, MD . Documentation: I have reviewed the above documentation for accuracy and  completeness, and I agree with the above.  Sarina Ser, MD

## 2020-03-05 NOTE — Patient Instructions (Signed)

## 2020-03-26 ENCOUNTER — Other Ambulatory Visit: Payer: Self-pay

## 2020-03-26 ENCOUNTER — Ambulatory Visit (AMBULATORY_SURGERY_CENTER): Payer: Self-pay | Admitting: *Deleted

## 2020-03-26 VITALS — Temp 97.1°F | Ht 64.5 in | Wt 158.0 lb

## 2020-03-26 DIAGNOSIS — Z800 Family history of malignant neoplasm of digestive organs: Secondary | ICD-10-CM

## 2020-03-26 DIAGNOSIS — Z86010 Personal history of colonic polyps: Secondary | ICD-10-CM

## 2020-03-26 DIAGNOSIS — Z01818 Encounter for other preprocedural examination: Secondary | ICD-10-CM

## 2020-03-26 MED ORDER — SUTAB 1479-225-188 MG PO TABS: 1.0000 | ORAL_TABLET | ORAL | 0 refills | Status: DC

## 2020-03-26 NOTE — Progress Notes (Signed)
Patient is here in-person for PV. Patient denies any allergies to eggs or soy. Patient denies any problems with anesthesia/sedation. Patient denies any oxygen use at home. Patient denies taking any diet/weight loss medications or blood thinners. Patient is not being treated for MRSA or C-diff. Patient is aware of our care-partner policy and 0000000 safety protocol. EMMI education assisgned to the patient for the procedure, this was explained and instructions given to patient.   COVID-19 screening test is on 04/13/20, the pt is aware.   Prep Prescription coupon given to the patient.

## 2020-04-09 ENCOUNTER — Encounter: Payer: Medicare Other | Admitting: Internal Medicine

## 2020-04-12 ENCOUNTER — Other Ambulatory Visit: Payer: Self-pay | Admitting: Internal Medicine

## 2020-04-13 ENCOUNTER — Other Ambulatory Visit
Admission: RE | Admit: 2020-04-13 | Discharge: 2020-04-13 | Disposition: A | Discharge status: Home / Self Care | Payer: Medicare Other | Source: Ambulatory Visit | Attending: Internal Medicine | Admitting: Internal Medicine

## 2020-04-13 DIAGNOSIS — Z20822 Contact with and (suspected) exposure to covid-19: Secondary | ICD-10-CM | POA: Diagnosis not present

## 2020-04-13 DIAGNOSIS — Z01812 Encounter for preprocedural laboratory examination: Secondary | ICD-10-CM | POA: Insufficient documentation

## 2020-04-13 LAB — SARS CORONAVIRUS 2 (TAT 6-24 HRS): SARS Coronavirus 2: NEGATIVE

## 2020-04-17 ENCOUNTER — Other Ambulatory Visit: Payer: Self-pay

## 2020-04-17 ENCOUNTER — Encounter: Payer: Self-pay | Admitting: Internal Medicine

## 2020-04-17 ENCOUNTER — Ambulatory Visit (AMBULATORY_SURGERY_CENTER): Payer: Medicare Other | Admitting: Internal Medicine

## 2020-04-17 VITALS — BP 103/40 | HR 74 | Temp 96.2°F | Resp 11 | Ht 64.5 in | Wt 158.0 lb

## 2020-04-17 DIAGNOSIS — Z86010 Personal history of colonic polyps: Secondary | ICD-10-CM

## 2020-04-17 DIAGNOSIS — Z800 Family history of malignant neoplasm of digestive organs: Secondary | ICD-10-CM | POA: Diagnosis not present

## 2020-04-17 MED ORDER — SODIUM CHLORIDE 0.9 % IV SOLN: 500.0000 mL | Freq: Once | INTRAVENOUS | Status: DC

## 2020-04-17 NOTE — Progress Notes (Signed)
pt tolerated well. VSS. awake and to recovery. Report given to RN.  

## 2020-04-17 NOTE — Progress Notes (Signed)
Vitals-CW ?

## 2020-04-17 NOTE — Op Note (Signed)
[place] Patient Name: [name] Procedure Date: 04/17/2020 8:49 AM MRN: [id number] Endoscopist: Jerene Bears , MD Age: 69 Referring MD:  Date of Birth: 07-25-1951 Gender: Female Account #: [id number] Procedure:                Colonoscopy Indications:              High risk colon cancer surveillance: Personal                            history of previous adenomas (multiple) Personal                            history of sessile serrated colon polyp (10 mm or                            greater in size); family history of colon cancer in                            1st degree relative, last colonoscopy March 2018 Medicines:                Monitored Anesthesia Care Procedure:                Pre-Anesthesia Assessment:                           - Prior to the procedure, a History and Physical                            was performed, and patient medications and                            allergies were reviewed. The patient's tolerance of                            previous anesthesia was also reviewed. The risks                            and benefits of the procedure and the sedation                            options and risks were discussed with the patient.                            All questions were answered, and informed consent                            was obtained. Prior Anticoagulants: The patient has                            taken no previous anticoagulant or antiplatelet                            agents. ASA Grade Assessment: II - A patient with  mild systemic disease. After reviewing the risks                            and benefits, the patient was deemed in                            satisfactory condition to undergo the procedure.                           After obtaining informed consent, the colonoscope                            was passed under direct vision. Throughout the                            procedure, the  patient's blood pressure, pulse, and                            oxygen saturations were monitored continuously. The                            Colonoscope was introduced through the anus and                            advanced to the terminal ileum. The colonoscopy was                            performed without difficulty. The patient tolerated                            the procedure well. The quality of the bowel                            preparation was good. The ileocecal valve,                            appendiceal orifice, and rectum were photographed. Scope In: 8:57:13 AM Scope Out: 9:14:46 AM Scope Withdrawal Time: 0 hours 12 minutes 35 seconds  Total Procedure Duration: 0 hours 17 minutes 33 seconds  Findings:                 The digital rectal exam was normal.                           The terminal ileum appeared normal.                           Small-mouthed diverticula were found in the sigmoid                            colon.                           Internal hemorrhoids were found during  retroflexion. The hemorrhoids were small.                           The exam was otherwise without abnormality. Complications:            No immediate complications. Estimated Blood Loss:     Estimated blood loss: none. Impression:               - The examined portion of the ileum was normal.                           - Diverticulosis in the sigmoid colon.                           - Small internal hemorrhoids.                           - No specimens collected. Recommendation:           - Patient has a contact number available for                            emergencies. The signs and symptoms of potential                            delayed complications were discussed with the                            patient. Return to normal activities tomorrow.                            Written discharge instructions were provided to the                             patient.                           - Resume previous diet.                           - Continue present medications.                           - Repeat colonoscopy in 5 years for surveillance. Jerene Bears, MD 04/17/2020 9:22:09 AM This report has been signed electronically.

## 2020-04-17 NOTE — Patient Instructions (Signed)
Handouts on hemorrhoids and diverticulosis given to you today  YOU HAD AN ENDOSCOPIC PROCEDURE TODAY AT THE [facility]:   Refer to the procedure report that was given to you for any specific questions about what was found during the examination.  If the procedure report does not answer your questions, please call your gastroenterologist to clarify.  If you requested that your care partner not be given the details of your procedure findings, then the procedure report has been included in a sealed envelope for you to review at your convenience later.  YOU SHOULD EXPECT: Some feelings of bloating in the abdomen. Passage of more gas than usual.  Walking can help get rid of the air that was put into your GI tract during the procedure and reduce the bloating. If you had a lower endoscopy (such as a colonoscopy or flexible sigmoidoscopy) you may notice spotting of blood in your stool or on the toilet paper. If you underwent a bowel prep for your procedure, you may not have a normal bowel movement for a few days.  Please Note:  You might notice some irritation and congestion in your nose or some drainage.  This is from the oxygen used during your procedure.  There is no need for concern and it should clear up in a day or so.  SYMPTOMS TO REPORT IMMEDIATELY:  Following lower endoscopy (colonoscopy or flexible sigmoidoscopy):  Excessive amounts of blood in the stool  Significant tenderness or worsening of abdominal pains  Swelling of the abdomen that is new, acute  Fever of 100F or higher  For urgent or emergent issues, a gastroenterologist can be reached at any hour by calling [phone]. Do not use MyChart messaging for urgent concerns.    DIET:  We do recommend a small meal at first, but then you may proceed to your regular diet.  Drink plenty of fluids but you should avoid alcoholic beverages for 24 hours.  ACTIVITY:  You should plan to take it easy for the rest of today and you  should NOT DRIVE or use heavy machinery until tomorrow (because of the sedation medicines used during the test).    FOLLOW UP: Our staff will call the number listed on your records 48-72 hours following your procedure to check on you and address any questions or concerns that you may have regarding the information given to you following your procedure. If we do not reach you, we will leave a message.  We will attempt to reach you two times.  During this call, we will ask if you have developed any symptoms of COVID 19. If you develop any symptoms (ie: fever, flu-like symptoms, shortness of breath, cough etc.) before then, please call [phone].  If you test positive for Covid 19 in the 2 weeks post procedure, please call and report this information to us.     SIGNATURES/CONFIDENTIALITY: You and/or your care partner have signed paperwork which will be entered into your electronic medical record.  These signatures attest to the fact that that the information above on your After Visit Summary has been reviewed and is understood.  Full responsibility of the confidentiality of this discharge information lies with you and/or your care-partner.  

## 2020-04-19 ENCOUNTER — Telehealth: Payer: Self-pay

## 2020-04-19 ENCOUNTER — Telehealth: Payer: Self-pay | Admitting: *Deleted

## 2020-04-19 NOTE — Telephone Encounter (Signed)
  Follow up Call-  Call back number 04/17/2020  Post procedure Call Back phone  # [phone]  Permission to leave phone message Yes  Some recent data might be hidden    LMOM to call back with any questions or concerns.  Also, call back if patient has developed fever, respiratory issues or been dx with COVID or had any family members or close contacts diagnosed since her procedure.

## 2020-04-19 NOTE — Telephone Encounter (Signed)
Left message on follow up call. 

## 2020-05-14 ENCOUNTER — Other Ambulatory Visit: Payer: Self-pay | Admitting: Internal Medicine

## 2020-05-14 DIAGNOSIS — Z1231 Encounter for screening mammogram for malignant neoplasm of breast: Secondary | ICD-10-CM

## 2020-06-04 ENCOUNTER — Ambulatory Visit: Payer: Medicare Other | Admitting: Dermatology

## 2020-06-19 ENCOUNTER — Ambulatory Visit: Payer: Medicare Other | Admitting: Dermatology

## 2020-06-19 ENCOUNTER — Other Ambulatory Visit: Payer: Self-pay

## 2020-06-19 DIAGNOSIS — L501 Idiopathic urticaria: Secondary | ICD-10-CM

## 2020-06-19 DIAGNOSIS — L719 Rosacea, unspecified: Secondary | ICD-10-CM

## 2020-06-19 MED ORDER — OMALIZUMAB 150 MG ~~LOC~~ SOLR: SUBCUTANEOUS | 5 refills | Status: DC

## 2020-06-19 NOTE — Progress Notes (Signed)
   Follow-Up Visit   Subjective  [name] is a 69 y.o. female who presents for the following: 6 mo follow-up urticaria (Patient had been doing well with little to no breakouts. She flared 3 weeks ago with breakouts every day. She is currently taking Montelukast 10mg , Famotidine 40mg , Cetirizine 10mg , Benadryl, mometasone cream, and pimecrolimus.). She states the antihistamines make her very drowsy and would like to get off of those if possible. She had been off of medicines for at least a couple of months before recent flare.  Her rosacea is improving with treatment.  The following portions of the chart were reviewed this encounter and updated as appropriate:  Tobacco  Allergies  Meds  Problems  Med Hx  Surg Hx  Fam Hx     Review of Systems:  No other skin or systemic complaints except as noted in HPI or Assessment and Plan.  Objective  Well appearing patient in no apparent distress; mood and affect are within normal limits.  A focused examination was performed including back, arms, chest. Relevant physical exam findings are noted in the Assessment and Plan.  Objective  Face: Mild erythema on cheeks, nose.  Objective  Lower legs, arms, lower back, buttocks: Urticarial plaques with dermatographism on lower legs, arms.  Assessment & Plan    Rosacea Face Improved. Continue Rosacea cream QD as previously prescribed. 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 Pt wants to be more aggressive with treatment and pursue Xolair shots.  Discussed Xolair injections. Will submit for approval to [facility].  Continue Montelukast and Famotidine po as previously prescribed. Continue Benadryl and Cetirizine po QD Continue mometasone cream qd prn. Continue pimecrolimus cream qd prn.  omalizumab (XOLAIR) 150 MG  injection - Lower legs, arms, lower back, buttocks  Return in about 1 week (around 06/26/2020) for Xolair injection.  Lindi Adie, CMA, am acting as scribe for Sarina Ser, MD .  Documentation: I have reviewed the above documentation for accuracy and completeness, and I agree with the above.  Sarina Ser, MD

## 2020-06-20 ENCOUNTER — Telehealth: Payer: Self-pay

## 2020-06-20 NOTE — Telephone Encounter (Signed)
She may continue her antihistamines as recommended the day of Xolair injection.

## 2020-06-20 NOTE — Telephone Encounter (Signed)
Patient called regarding her follow up appointment for a Xolair injection. She is concerned about her antihistamines. She takes 2 every AM and 4 every PM. Should she take those the day before or the day of?

## 2020-06-20 NOTE — Telephone Encounter (Signed)
Patient advised of information per Dr. Kowalski.  

## 2020-06-22 ENCOUNTER — Encounter: Payer: Self-pay | Admitting: Dermatology

## 2020-06-25 ENCOUNTER — Ambulatory Visit: Payer: Medicare Other | Admitting: Dermatology

## 2020-06-25 ENCOUNTER — Other Ambulatory Visit: Payer: Self-pay

## 2020-06-25 VITALS — BP 108/69

## 2020-06-25 DIAGNOSIS — L501 Idiopathic urticaria: Secondary | ICD-10-CM | POA: Diagnosis not present

## 2020-06-25 DIAGNOSIS — L509 Urticaria, unspecified: Secondary | ICD-10-CM

## 2020-06-25 MED ORDER — OMALIZUMAB 150 MG/ML ~~LOC~~ SOSY
150.0000 mg | PREFILLED_SYRINGE | Freq: Once | SUBCUTANEOUS | Status: AC
  Administered 2020-06-25: 150 mg via SUBCUTANEOUS

## 2020-06-25 NOTE — Progress Notes (Signed)
   Follow-Up Visit   Subjective  [name] is a 69 y.o. female who presents for the following: Urticaria flare (this episode x 28m, very itchy and stingy, Montelukast and Famotidine, benadryl and Cetirizine, mometasone, elidel cream, calimine lotion).  The following portions of the chart were reviewed this encounter and updated as appropriate:  Tobacco  Allergies  Meds  Problems  Med Hx  Surg Hx  Fam Hx     Review of Systems:  No other skin or systemic complaints except as noted in HPI or Assessment and Plan.  Objective  Well appearing patient in no apparent distress; mood and affect are within normal limits.  A focused examination was performed including arms, legs, trunk, face. Relevant physical exam findings are noted in the Assessment and Plan.  Objective  arms, legs, trunk, face: Urticarial patches trunk, arms, legs, face   Assessment & Plan  Urticaria - Chronic Idiopathic Urticaria (CIU) arms, legs, trunk, face severe and symptomatic Biopsy proven, with recent and currently active flare and with side effects to current treatment regimen. Pt wants to be more aggressive with treatment and pursue Xolair shots.  Continue Montelukast and Famotidine po as previously prescribed. Continue Benadryl QD Continue mometasone cream qd prn. Continue pimecrolimus cream qd prn. May d/c Cetirizine if makes her too drowsey BP 10:44 121/73 before Xolair BP 11:15 117/68 51mins after Xolair injections BP 11:30 108/69 22mins after Xolair injections Start Xolair 150mg   x 2sq injections to R and L upper arm, Lot [id number] exp 06/2020 10:59 (samples given to patient)  omalizumab Arvid Right) prefilled syringe 150 mg - arms, legs, trunk, face  omalizumab Arvid Right) prefilled syringe 150 mg - arms, legs, trunk, face Over 40 minutes spent with pt for evaluation and management today.  Return in about 1 month (around 07/26/2020) for Urticaria, Xolair injection.  I, Othelia Pulling, RMA, am acting as  scribe for Sarina Ser, MD .  Documentation: I have reviewed the above documentation for accuracy and completeness, and I agree with the above.  Sarina Ser, MD

## 2020-06-26 ENCOUNTER — Ambulatory Visit
Admission: RE | Admit: 2020-06-26 | Discharge: 2020-06-26 | Disposition: A | Discharge status: Home / Self Care | Payer: Medicare Other | Source: Ambulatory Visit | Attending: Internal Medicine | Admitting: Internal Medicine

## 2020-06-26 ENCOUNTER — Encounter: Payer: Self-pay | Admitting: Dermatology

## 2020-06-26 DIAGNOSIS — Z1231 Encounter for screening mammogram for malignant neoplasm of breast: Secondary | ICD-10-CM | POA: Diagnosis not present

## 2020-06-27 ENCOUNTER — Telehealth: Payer: Self-pay

## 2020-06-27 NOTE — Telephone Encounter (Signed)
Noted. Pt is very sensitive to all things, even pressure on her skin. If she feels her condition is worsening, go to ER and call us. It sounds like she is Ok and will likely have some side effect from the medication.

## 2020-06-27 NOTE — Telephone Encounter (Signed)
Patient advised of information per Dr. Kowalski.  

## 2020-06-27 NOTE — Telephone Encounter (Signed)
Patient called to document her reaction from Xolair injection. She states as of this morning her lips are slightly swollen and the area around her eyes are puffy. She has no trouble breathing. She does feel as she has a slightly elevated heart beat and a lightheaded feeling. She does not feel as if she needs to go to the ER at this time. She did have a much worse reaction to the COVID-19 vaccine.

## 2020-07-04 ENCOUNTER — Telehealth: Payer: Self-pay | Admitting: Internal Medicine

## 2020-07-04 ENCOUNTER — Telehealth: Payer: Self-pay

## 2020-07-04 NOTE — Telephone Encounter (Signed)
Pt also mention she had her 1St covid vaccine in feb.  She ended up in er with her face swelling she stated she was told not to talk 2nd vaccine at that time.  She wanted to know what she needs to do with 2nd vaccine or booster shot if this comes out  Please advise

## 2020-07-04 NOTE — Telephone Encounter (Signed)
Yes. May send Prednisone 20 mg  Take 2 po qd x 2 days then 1 po qd x 2 days, then 1 po qod x 4 doses.  Disp # 10  [id number] Potential Short term side effects: water weight gain; increased appetite; moodiness Advise pt that although Prednisone tends to help, it is not something we should depend on long term due to side effects and she should continue Xolair for now and keep follow up appt as scheduled.

## 2020-07-04 NOTE — Telephone Encounter (Signed)
Patient called stating she is miserable with hives and itching. She can not sleep at night and she is not sure if the Xolair is helping. She can not find any relief. She is asking if you would prescribe a Prednisone prescription for her.

## 2020-07-04 NOTE — Telephone Encounter (Signed)
Pt stated her hives has come back and she has a call into dr Nehemiah Massed derm.  To see if they will give her a rx of prednisone until she sees him in sept

## 2020-07-04 NOTE — Telephone Encounter (Signed)
Spoke to pt. States she understands and will decide later what to do.

## 2020-07-04 NOTE — Telephone Encounter (Signed)
Her reaction was severe enough that I think she should not take the 2nd dose. However, if things get worse with the delta variant, etc---if may be better to take the low risk of reaction to reduce the infection risk

## 2020-07-05 MED ORDER — PREDNISONE 20 MG PO TABS: 20.0000 mg | ORAL_TABLET | ORAL | 0 refills | Status: DC

## 2020-07-05 NOTE — Telephone Encounter (Signed)
Patient advised of information per Dr. Nehemiah Massed. Prednisone prescription has been sent in.

## 2020-07-06 ENCOUNTER — Other Ambulatory Visit: Payer: Self-pay | Admitting: Internal Medicine

## 2020-07-06 ENCOUNTER — Encounter: Payer: Medicare Other | Admitting: Internal Medicine

## 2020-07-12 ENCOUNTER — Telehealth: Payer: Self-pay

## 2020-07-12 DIAGNOSIS — L501 Idiopathic urticaria: Secondary | ICD-10-CM

## 2020-07-12 MED ORDER — OMALIZUMAB 150 MG ~~LOC~~ SOLR: SUBCUTANEOUS | 5 refills | Status: DC

## 2020-07-12 NOTE — Telephone Encounter (Signed)
error 

## 2020-07-16 ENCOUNTER — Other Ambulatory Visit: Payer: Self-pay

## 2020-07-16 DIAGNOSIS — L501 Idiopathic urticaria: Secondary | ICD-10-CM

## 2020-07-16 MED ORDER — OMALIZUMAB 150 MG ~~LOC~~ SOLR: SUBCUTANEOUS | 5 refills | Status: DC

## 2020-07-17 ENCOUNTER — Telehealth: Payer: Self-pay

## 2020-07-17 NOTE — Telephone Encounter (Signed)
Patient called this morning almost in tears and very upset due to a current flare. Patient talked almost for 7 minutes regarding how she cant sleep and stays covered in ice packs to keep her skin calm from the itching and trying to not cause any bleeding or tearing. She says this flare is the worse she has ever seen and now she has swelling in her face and dark swollen places below her eyes.

## 2020-07-17 NOTE — Telephone Encounter (Signed)
She is already on all meds that usually are prescribed for this condition including Xolair (cannot give another Xolair for 3 weeks). Only thing to offer is STEROID SHOT.   She may come in today for that, but has side effects of Water weight gain; increased appetite and moodiness.

## 2020-07-18 ENCOUNTER — Ambulatory Visit: Payer: Medicare Other | Admitting: Dermatology

## 2020-07-18 ENCOUNTER — Other Ambulatory Visit: Payer: Self-pay

## 2020-07-18 DIAGNOSIS — L509 Urticaria, unspecified: Secondary | ICD-10-CM | POA: Diagnosis not present

## 2020-07-18 MED ORDER — TRIAMCINOLONE ACETONIDE 40 MG/ML IJ SUSP: 40.0000 mg | Freq: Once | INTRAMUSCULAR | Status: DC

## 2020-07-18 MED ORDER — TRIAMCINOLONE ACETONIDE 40 MG/ML IJ SUSP
40.0000 mg | Freq: Once | INTRAMUSCULAR | Status: AC
  Administered 2020-07-18: 40 mg via INTRAMUSCULAR

## 2020-07-18 NOTE — Progress Notes (Signed)
   Follow-Up Visit   Subjective  [name] is a 69 y.o. female who presents for the following: Urticaria (Pt called office yesterday, 8/24, about recent flare up on face. Too soon for next Xolair injection.).  The following portions of the chart were reviewed this encounter and updated as appropriate: Tobacco  Allergies  Meds  Problems  Med Hx  Surg Hx  Fam Hx     Review of Systems: No other skin or systemic complaints except as noted in HPI or Assessment and Plan.  Objective  Well appearing patient in no apparent distress; mood and affect are within normal limits.  A focused examination was performed including face, chest, and arms. Relevant physical exam findings are noted in the Assessment and Plan.  Objective  Arms, Chest, Face: Urticaria plaques to face, chest, and arms.  Assessment & Plan  Urticaria (3) Face; Arms; Chest  Patient called office yesterday, 07/17/20, due to recent flare up causing extreme itching, facial edema, and insomnia. Was advised she is already being treated with all medications usually prescribed for this condition. She was told that she may come in for a steroid injection and advised of side effects of water weight gain, increased appetite, and mood swings.   Discussed urticaria can have multiple causes such as stress, food, and other medications.   Advised patient okay to move up September Xolair injection a few days due to plans to be out of town for October dose.   Advised patient may benefit from seeing an allergist. Will place referral.   Discussed switching from Benadryl to a non-drowsy antihistamine as there are no added benefits to taking Benadryl vs non-drowsy antihistamine.   Okayed stopping Mometasone and Pimecrolimus if not seeing any improvement.   40 mg Triamcinolone Acetonide IM injection given today.   Other Related Procedures Ambulatory referral to Allergy  Follow up as scheduled.  IHarriett Sine, CMA, am acting as  scribe for Sarina Ser, MD.  Documentation: I have reviewed the above documentation for accuracy and completeness, and I agree with the above.  Sarina Ser, MD

## 2020-07-18 NOTE — Telephone Encounter (Signed)
Spoke with patient this morning and she would like to come in for injection. Scheduled at 3:45 this afternoon.

## 2020-07-25 ENCOUNTER — Encounter: Payer: Self-pay | Admitting: Dermatology

## 2020-07-26 ENCOUNTER — Encounter: Payer: Self-pay | Admitting: Dermatology

## 2020-07-26 ENCOUNTER — Other Ambulatory Visit: Payer: Self-pay

## 2020-07-26 ENCOUNTER — Ambulatory Visit: Payer: Medicare Other | Admitting: Dermatology

## 2020-07-26 DIAGNOSIS — L509 Urticaria, unspecified: Secondary | ICD-10-CM

## 2020-07-26 DIAGNOSIS — L501 Idiopathic urticaria: Secondary | ICD-10-CM | POA: Diagnosis not present

## 2020-07-26 MED ORDER — OMALIZUMAB 150 MG/ML ~~LOC~~ SOSY
150.0000 mg | PREFILLED_SYRINGE | Freq: Once | SUBCUTANEOUS | Status: AC
  Administered 2020-07-26: 150 mg via SUBCUTANEOUS

## 2020-07-26 NOTE — Progress Notes (Signed)
   Follow-Up Visit   Subjective  [name] is a 69 y.o. female who presents for the following: Urticaria - Chronic Idiopathic Urticaria (CIU) (face,arms, legs, trunk 53m f/u Xolair, Montelukast and Famotidine po, Mometasone cr qd, Pimecrolimus cr qd,  Benadryl QD, pt said she may have had a few days of improvement with first Xolair injections, IM Kenalog injection 07/18/20) and hx of postive patch test readings (Pt does have hx of + patch test to Cobalt, Isothiazolinone, and Gold, pt did say that ~10 prior to breaking out in hives she had dental work with gold crown.  She mentioned to her dentist the things she reacted to and her dentist said those are all the things we use.  ).  The following portions of the chart were reviewed this encounter and updated as appropriate:  Tobacco  Allergies  Meds  Problems  Med Hx  Surg Hx  Fam Hx     Review of Systems:  No other skin or systemic complaints except as noted in HPI or Assessment and Plan.  Objective  Well appearing patient in no apparent distress; mood and affect are within normal limits.  A focused examination was performed including arm, legs. Relevant physical exam findings are noted in the Assessment and Plan.  Objective  Arms, legs, trunk: Dermatographisim and active urticarial patches arms, legs   Assessment & Plan  Urticaria - Severe and persistent with flare Arms, legs, trunk  Urticaria - Chronic Idiopathic Urticaria (CIU) severe and symptomatic  Biopsy proven, with recent and currently active flare.  Recent patch test + for GOLD; COBALT and ISOTHIAZOLINONE. Pt had dental work 10 days prior to hives and dentist says these products used in dental work. (Could this be related or cause?  Current dogma is that dental hardware and orthopedic hardware not related.  Pt also has a Titanium piece in arm.)  Recommend pt f/u with Dr. Donneta Romberg for consult /opinion / other treatment recommendations / comment on dental work relation  to current hives and consider a RAST test, will send referral.  Xolair 150mg /ml sub q injections x 2 today to R and L upper arm. (2nd set of Xolair injections today) Lot #[id number] exp 06/2021 and [id number] 06/2021  Continue Montelukast and Famotidine po as previously prescribed. Continue Benadryl QD - but recommended last week to change               Benadryl to Claritin or Allegra Continue mometasone cream qd prn. Continue pimecrolimus cream qd prn.        Advised last week - may Discontinue these topicals if not helpful Pt had IM steroid last week to supplement these treatments due to flare.  This has helped some.   BP before Xolair injection 9:00am  116/69 BP after injections 9:17am 118/66 BP 15 mins after injections 9:32am 127/71 BP 30 mins after injections 9:47am 127/70  omalizumab (XOLAIR) prefilled syringe 150 mg - Arms, legs, trunk  omalizumab Arvid Right) prefilled syringe 150 mg - Arms, legs, trunk  Other Related Procedures Ambulatory referral to Allergy  Return in about 1 month (around 08/25/2020) for Uticaria, Xolair injection.  I, Othelia Pulling, RMA, am acting as scribe for Sarina Ser, MD .  Documentation: I have reviewed the above documentation for accuracy and completeness, and I agree with the above.  Sarina Ser, MD

## 2020-08-01 ENCOUNTER — Ambulatory Visit: Payer: Medicare Other | Admitting: Dermatology

## 2020-08-06 ENCOUNTER — Telehealth: Payer: Self-pay

## 2020-08-06 NOTE — Telephone Encounter (Signed)
May D/C Famotidine to see how she does without it.

## 2020-08-06 NOTE — Telephone Encounter (Signed)
Pt left voicemail stating that she is out of refills for the Famotidine 40 mg. She says that she has been having less frequent breakouts of hives and was wondering if she needs to continue taking Famotidine. Will need refill if to continue taking.

## 2020-08-07 NOTE — Telephone Encounter (Signed)
Fax is ready. In your tray to review.

## 2020-08-07 NOTE — Telephone Encounter (Signed)
Spoke with pt about stopping the Famotidine. She informed me that the referral we placed for the RAST test was not scheduled. Pt was told that we can order the test ourselves and that she did not need to be seen with Dr. Rush Landmark office to have it done. They canceled our referral. Please advise how you would like to proceed with the situation.

## 2020-08-07 NOTE — Telephone Encounter (Signed)
The referral was for evaluation /opinion/recommendations and consideration for RAST testing.  Please see the paragraph in the Last Note and repeat that word for word on the referral request. Review with me before sending request.

## 2020-08-17 ENCOUNTER — Telehealth: Payer: Self-pay

## 2020-08-17 NOTE — Telephone Encounter (Signed)
Called [facility] to check on referral for patient with Dr. Oris Drone and she was not scheduled per receptionist because she is on xolair and they said we could do the RAST testing.  I advised we don't do the RAST testing and patient does need referral appointment.  She said she would talk to Dr. Donneta Romberg and try to get patient scheduled./sh

## 2020-08-28 ENCOUNTER — Other Ambulatory Visit: Payer: Self-pay

## 2020-08-28 ENCOUNTER — Telehealth: Payer: Self-pay

## 2020-08-28 ENCOUNTER — Ambulatory Visit: Payer: Medicare Other | Admitting: Dermatology

## 2020-08-28 VITALS — BP 137/83 | HR 86

## 2020-08-28 DIAGNOSIS — L853 Xerosis cutis: Secondary | ICD-10-CM

## 2020-08-28 DIAGNOSIS — L509 Urticaria, unspecified: Secondary | ICD-10-CM

## 2020-08-28 DIAGNOSIS — L814 Other melanin hyperpigmentation: Secondary | ICD-10-CM

## 2020-08-28 DIAGNOSIS — L578 Other skin changes due to chronic exposure to nonionizing radiation: Secondary | ICD-10-CM

## 2020-08-28 DIAGNOSIS — L501 Idiopathic urticaria: Secondary | ICD-10-CM | POA: Diagnosis not present

## 2020-08-28 DIAGNOSIS — D229 Melanocytic nevi, unspecified: Secondary | ICD-10-CM | POA: Diagnosis not present

## 2020-08-28 DIAGNOSIS — Z1283 Encounter for screening for malignant neoplasm of skin: Secondary | ICD-10-CM | POA: Diagnosis not present

## 2020-08-28 DIAGNOSIS — D1800 Hemangioma unspecified site: Secondary | ICD-10-CM

## 2020-08-28 DIAGNOSIS — L821 Other seborrheic keratosis: Secondary | ICD-10-CM

## 2020-08-28 DIAGNOSIS — L720 Epidermal cyst: Secondary | ICD-10-CM

## 2020-08-28 MED ORDER — OMALIZUMAB 150 MG/ML ~~LOC~~ SOSY
150.0000 mg | PREFILLED_SYRINGE | Freq: Once | SUBCUTANEOUS | Status: AC
  Administered 2020-08-28: 150 mg via SUBCUTANEOUS

## 2020-08-28 NOTE — Progress Notes (Signed)
   Follow-Up Visit   Subjective  [name] is a 69 y.o. female who presents for the following: Annual Exam (no new or changing moles, lesions, or spots. ) and urticaria (patient tolerating Xolair well and is here for her monthly injection. She would also like to know if she should continue the Singulair and Levocitirizine since the urticaria has calmed down since starting the Xolair). Patient has had three flares this past month, but none of them were itchy.  The patient presents for Total-Body Skin Exam (TBSE) for skin cancer screening and mole check.  The following portions of the chart were reviewed this encounter and updated as appropriate:  Tobacco  Allergies  Meds  Problems  Med Hx  Surg Hx  Fam Hx     Review of Systems:  No other skin or systemic complaints except as noted in HPI or Assessment and Plan.  Objective  Well appearing patient in no apparent distress; mood and affect are within normal limits.  A full examination was performed including scalp, head, eyes, ears, nose, lips, neck, chest, axillae, abdomen, back, buttocks, bilateral upper extremities, bilateral lower extremities, hands, feet, fingers, toes, fingernails, and toenails. All findings within normal limits unless otherwise noted below.  Objective  trunk, extremities: Dermatographism, urticarial papules on the abdomen and inframammary areas.  Objective  trunk and extremities: Dry flaky skin.  Objective  L oral commisure: Small firm papule.  Assessment & Plan  Urticaria -severe chronic idiopathic urticaria.  Improving on Xolair systemic shots but persistent with some flares trunk, extremities  Continue Xolair SQ QM for at least 2 more months since patient is still experiencing breakthrough hives. If still doing well in two weeks try stopping the Singulair and Levocitirizine. Ok to stay off of Famotidine.   Xolair 150mg  injected SQ into the R upper arm posterior. Xolair 150mg  injected SQ into the L  upper arm posterior. Patient tolerated injections well. AL, CMA  Lot # [id number] Exp. Date: 06/2021  Patient remained in office with every 15-minute blood pressure and vital sign checks and left after 60 minutes without issues  omalizumab Arvid Right) prefilled syringe 150 mg - trunk, extremities  omalizumab Arvid Right) prefilled syringe 150 mg - trunk, extremities  Xerosis cutis trunk and extremities Recommend CeraVe cream moisturizer daily.   Epidermal inclusion cyst L oral commisure Benign, discussed extraction if bothersome.   Lentigines - Scattered tan macules - Discussed due to sun exposure - Benign, observe - Call for any changes  Seborrheic Keratoses - Stuck-on, waxy, tan-brown papules and plaques  - Discussed benign etiology and prognosis. - Observe - Call for any changes  Melanocytic Nevi - Tan-brown and/or pink-flesh-colored symmetric macules and papules - Benign appearing on exam today - Observation - Call clinic for new or changing moles - Recommend daily use of broad spectrum spf 30+ sunscreen to sun-exposed areas.   Hemangiomas - Red papules - Discussed benign nature - Observe - Call for any changes  Actinic Damage - diffuse scaly erythematous macules with underlying dyspigmentation - Recommend daily broad spectrum sunscreen SPF 30+ to sun-exposed areas, reapply every 2 hours as needed.  - Call for new or changing lesions.  Skin cancer screening performed today.  Return in about 1 month (around 09/28/2020) for Xolair injections.  Luther Redo, CMA, am acting as scribe for Sarina Ser, MD .  Documentation: I have reviewed the above documentation for accuracy and completeness, and I agree with the above.  Sarina Ser, MD

## 2020-08-28 NOTE — Telephone Encounter (Signed)
I contacted Dr. Rush Landmark office to check on patient's referral for RAST testing. The receptionist said that they left a message on patient's voicemail on 08/08/20, but haven't heard back from her yet. While in the office today patient stated that the she had spoke with them and they needed more information from our office to schedule the appointment. I informed the receptionist of this, and she tried to transfer me to the nurse, but he/she was unavailable. She said she would leave a message for the nurse to return my call.

## 2020-08-28 NOTE — Patient Instructions (Signed)
Gentle Skin Care Guide  1. Bathe no more than once a day.  2. Avoid bathing in hot water  3. Use a mild soap like Dove, Vanicream, Cetaphil, CeraVe. Can use Lever 2000 or Cetaphil antibacterial soap  4. Use soap only where you need it. On most days, use it under your arms, between your legs, and on your feet. Let the water rinse other areas unless visibly dirty.  5. When you get out of the bath/shower, use a towel to gently blot your skin dry, don't rub it.  6. While your skin is still a little damp, apply a moisturizing cream such as Vanicream, CeraVe, Cetaphil, Eucerin, Sarna lotion or plain Vaseline Jelly. For hands apply Neutrogena [place] Hand Cream or Excipial Hand Cream.  7. Reapply moisturizer any time you start to itch or feel dry.  8. Sometimes using free and clear laundry detergents can be helpful. Fabric softener sheets should be avoided. Downy Free & Gentle liquid, or any liquid fabric softener that is free of dyes and perfumes, it acceptable to use  9. If your doctor has given you prescription creams you may apply moisturizers over them

## 2020-08-29 ENCOUNTER — Encounter: Payer: Self-pay | Admitting: Dermatology

## 2020-08-30 ENCOUNTER — Other Ambulatory Visit: Payer: Self-pay

## 2020-08-30 DIAGNOSIS — L501 Idiopathic urticaria: Secondary | ICD-10-CM

## 2020-08-30 NOTE — Telephone Encounter (Signed)
I spoke with the patient this afternoon, and she stated that Parke Simmers at [facility] told her that either Dr. Nehemiah Massed or her PCP could order RAST testing through a [facility] like [place], [place], or [facility]. After speaking with Dr. Nehemiah Massed, we will make a referral to one of those practices to evaluate the patient for chronic idiopathic urticaria, and to pursue testing as they deem appropriate.

## 2020-09-01 ENCOUNTER — Ambulatory Visit (INDEPENDENT_AMBULATORY_CARE_PROVIDER_SITE_OTHER): Payer: Medicare Other

## 2020-09-01 ENCOUNTER — Other Ambulatory Visit: Payer: Self-pay

## 2020-09-01 DIAGNOSIS — Z23 Encounter for immunization: Secondary | ICD-10-CM

## 2020-09-26 ENCOUNTER — Other Ambulatory Visit: Payer: Self-pay

## 2020-09-26 ENCOUNTER — Ambulatory Visit: Payer: Medicare Other | Admitting: Dermatology

## 2020-09-26 ENCOUNTER — Encounter: Payer: Self-pay | Admitting: Dermatology

## 2020-09-26 DIAGNOSIS — L501 Idiopathic urticaria: Secondary | ICD-10-CM | POA: Diagnosis not present

## 2020-09-26 DIAGNOSIS — L509 Urticaria, unspecified: Secondary | ICD-10-CM | POA: Diagnosis not present

## 2020-09-26 MED ORDER — OMALIZUMAB 150 MG/ML ~~LOC~~ SOSY
300.0000 mg | PREFILLED_SYRINGE | Freq: Once | SUBCUTANEOUS | Status: AC
  Administered 2020-09-26: 300 mg via SUBCUTANEOUS

## 2020-09-26 MED ORDER — OMALIZUMAB 150 MG ~~LOC~~ SOLR: SUBCUTANEOUS | 5 refills | Status: DC

## 2020-09-26 NOTE — Progress Notes (Signed)
   Follow-Up Visit   Subjective  [name] is a 69 y.o. female who presents for the following: Follow-up (Urticaria - Xolair injection today. Has not had any hives in the last month (first hive free month since starting Xolair). She has not had any side effects from the Xolair).  The following portions of the chart were reviewed this encounter and updated as appropriate:  Tobacco  Allergies  Meds  Problems  Med Hx  Surg Hx  Fam Hx     Review of Systems:  No other skin or systemic complaints except as noted in HPI or Assessment and Plan.  Objective  Well appearing patient in no apparent distress; mood and affect are within normal limits.  A focused examination was performed including trunk, extremities. Relevant physical exam findings are noted in the Assessment and Plan.  Objective  Trunk and extremities: Clear today  B/P 9:15 - 121/78 9:33 - 122/71 9:53 - 120/76   Assessment & Plan  Urticaria Chronic, not currently at goal.   Trunk and extremities  Will plan to continue Xolair injections for a total of at least 6 months before considering stopping.  Xolair 150 mg to left upper arm, Xolair 150 mg to right upper arm - Lot #[id number] Exp 09/2021 x 2  omalizumab Arvid Right) prefilled syringe 300 mg - Trunk and extremities  Chronic idiopathic urticaria  Reordered Medications omalizumab (XOLAIR) 150 MG injection  Return in about 1 month (around 10/26/2020) for Xolair injection.   I, Ashok Cordia, CMA, am acting as scribe for Sarina Ser, MD .  Documentation: I have reviewed the above documentation for accuracy and completeness, and I agree with the above.  Sarina Ser, MD

## 2020-10-01 ENCOUNTER — Other Ambulatory Visit: Payer: Self-pay

## 2020-10-01 DIAGNOSIS — L501 Idiopathic urticaria: Secondary | ICD-10-CM

## 2020-10-01 MED ORDER — OMALIZUMAB 150 MG ~~LOC~~ SOLR: SUBCUTANEOUS | 5 refills | Status: DC

## 2020-10-01 NOTE — Progress Notes (Signed)
Pharmacy requested clarifications for directions of medication.

## 2020-10-03 ENCOUNTER — Telehealth: Payer: Self-pay | Admitting: *Deleted

## 2020-10-03 ENCOUNTER — Other Ambulatory Visit: Payer: Self-pay

## 2020-10-03 ENCOUNTER — Telehealth: Payer: Self-pay

## 2020-10-03 ENCOUNTER — Ambulatory Visit
Admission: EM | Admit: 2020-10-03 | Discharge: 2020-10-03 | Disposition: A | Discharge status: Home / Self Care | Payer: Medicare Other | Attending: Physician Assistant | Admitting: Physician Assistant

## 2020-10-03 DIAGNOSIS — J0140 Acute pansinusitis, unspecified: Secondary | ICD-10-CM

## 2020-10-03 DIAGNOSIS — Z1152 Encounter for screening for COVID-19: Secondary | ICD-10-CM | POA: Diagnosis not present

## 2020-10-03 MED ORDER — AZELASTINE HCL 0.1 % NA SOLN: 2.0000 | Freq: Two times a day (BID) | NASAL | 0 refills | Status: DC

## 2020-10-03 MED ORDER — DOXYCYCLINE HYCLATE 100 MG PO CAPS: 100.0000 mg | ORAL_CAPSULE | Freq: Two times a day (BID) | ORAL | 0 refills | Status: DC

## 2020-10-03 NOTE — ED Triage Notes (Signed)
Pt states she has had cough and congestion x 3 weeks and it has become productive with green tinged sputum over the last week. Pt is aox4 and ambulatory.

## 2020-10-03 NOTE — Telephone Encounter (Signed)
Patient called stating that she has a head cold  and a bad cough. Patient stated that this is starting the 3rd week. Patient stated that she has head and chest congestion. Patient stated that she is coughing up green mucus and blowing green stuff out of her nose. Patient stated that the drainage is real thick. Patient denies a fever at this time. Patient stated that she has taken Mucinex DM, Sudafed and Zyrtec but none of these seem to be helping much. Patient denies SOB or difficulty breathing. Patient stated that she does feel that she has a lot of chest congestion. Patient has not had a covid test done. Patient was advised that we do not have any appointments available today. Patient was advised with the chest congestion that she has she should have a face to face evaluation. Patient was advised that she may need a chest xray and was given information on the [facility] facilities which has xray equipment. Patient stated that she will plan on going to one of the UC's once she decides which is closer to her. Advised patient that this message will go back to Dr. Silvio Pate.

## 2020-10-03 NOTE — Telephone Encounter (Signed)
Left message for patient to return my call regarding [facility] referral placed 08/30/20.

## 2020-10-03 NOTE — Discharge Instructions (Signed)
COVID PCR testing ordered. Start doxycycline and azelastine nasal spray as directed. Hold nasacort for a few days, can restart if needed after 3-4 days for drainage. Tylenol/motrin for pain and fever. Keep hydrated, urine should be clear to pale yellow in color. If experiencing shortness of breath, trouble breathing, go to the emergency department for further evaluation needed.
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
## 2020-10-03 NOTE — Telephone Encounter (Signed)
I agree that she should have a face to face evaluation. Please check on her tomorrow to see how she made out

## 2020-10-04 LAB — SARS-COV-2, NAA 2 DAY TAT

## 2020-10-04 LAB — NOVEL CORONAVIRUS, NAA: SARS-CoV-2, NAA: NOT DETECTED

## 2020-10-04 NOTE — Telephone Encounter (Signed)
Spoke to pt. She said She is taking doxycycline and doxycycline. Seems to be doing a little better. Lungs were clear.

## 2020-10-08 ENCOUNTER — Telehealth: Payer: Self-pay

## 2020-10-08 NOTE — Telephone Encounter (Signed)
Contacted pt who reports the only reason she contacted after hours nursing is to inquire if she could restart mucinex. She reports she still had a hard, dry cough but is feeling a little better and sleeping a little better. But she thought she would be feeling totally better by now since she has been started on an abx. She inquired what OTC cough med she could use because she cannot use robitussin because she cannot tolerate it. Advised she could try delsym. Pt will pick some up. Advised if any symptoms worsen or she develops any new symptoms to contact office. Advised of ER precautions. Pt verbalized understanding.

## 2020-10-08 NOTE — Telephone Encounter (Signed)
[place] Night - Client TELEPHONE ADVICE RECORD [facility] Patient Name: NEKAYLA HEIDER Gender: Female DOB: [birth year] Age: 69 Y 73 M 24 D Return Phone Number: [phone] (Primary) Address: City/State/Zip: [place] [address] Client [facility] Night - Client Client Site [place] Physician Viviana Simpler- MD Contact Type Call Who Is Calling Patient / Member / Family / Caregiver Call Type Triage / Clinical Relationship To Patient Self Return Phone Number [phone] (Primary) Chief Complaint Cough Reason for Call Symptomatic / Request for [place] states she has cough, congestion and she did go to the urgent care. Her throat was irritated and so was ears. She was put on antibiotics and nose spray. She went off regular off regular antihistamine and Muscinex. Can she go back on her medicatioin? Additional Comment COVID was negative. She has one eye that is blood shot and one with discharge. She is coughing also. This has been going on for 3 weeks. Translation No Nurse Assessment Nurse: Alba Destine, RN, Melony Overly Date/Time (Eastern Time): 10/06/2020 11:56:30 AM Confirm and document reason for call. If symptomatic, describe symptoms. ---Caller states she has cough, congestion and she did go to the urgent care. Her throat was irritated and so was ears. She was put on antibiotics(doxycycline and nose spray. stopped taking zyrtec and Mucinex has been using. COVID was negative. She has one eye that is blood shot and one with discharge, guppy, light yellow. She is coughing also. This has been going on for 3 weeks. no fever. no aches. no pain.no wheezing. alert, awake. mucus has been better. caller still on the abt - 3 day. abt- is ordered ear infection Does the patient have any new or worsening symptoms? ---Yes Will a triage be completed? ---Yes Related visit to physician  within the last 2 weeks? ---No Does the PT have any chronic conditions? (i.e. diabetes, asthma, this includes High risk factors for pregnancy, etc.) ---No Is this a behavioral health or substance abuse call? ---No Guidelines Guideline Title Affirmed Question Affirmed Notes Nurse Date/Time Eilene Ghazi Time) Ear - Otitis Externa Follow-up Call [1] Taking antibiotic (ear drops or pills) > 72 hours Alba Destine, RN, [place] 10/06/2020 12:09:12 PM PLEASE NOTE: All timestamps contained within this report are represented as [place] Standard Time. CONFIDENTIALTY NOTICE: This fax transmission is intended only for the addressee. It contains information that is legally privileged, confidential or otherwise protected from use or disclosure. If you are not the intended recipient, you are strictly prohibited from reviewing, disclosing, copying using or disseminating any of this information or taking any action in reliance on or regarding this information. If you have received this fax in error, please notify us immediately by telephone so that we can arrange for its return to [place]. Phone: [phone], Toll-Free: [phone], Fax: [phone] Page: 2 of 2 Call Id: [id number] Guidelines Guideline Title Affirmed Question Affirmed Notes Nurse Date/Time Eilene Ghazi Time) (3 days) AND [2] ear pain not improved or recurs Disp. Time Eilene Ghazi Time) Disposition Final User 10/06/2020 12:11:55 PM See PCP within 24 Hours Yes Alba Destine, RN, [place] Disagree/Comply Comply Caller Understands Yes PreDisposition Did not know what to do Care Advice Given Per Guideline SEE PCP WITHIN 24 HOURS: CONTINUE ANTIBIOTIC: * Continue taking/using the antibiotics that were prescribed for you. * (Usually this is antibiotic ear drops, sometimes oral antibiotics). PAIN MEDICINES: * ACETAMINOPHEN - EXTRA STRENGTH TYLENOL: Take 1,000 mg (two 500 mg pills) every 8 hours as needed. Each Extra  Strength Tylenol pill has 500 mg of acetaminophen.  The most you should take each day is 3,000 mg (6 pills a day). * IBUPROFEN (E.G., MOTRIN, ADVIL): Take 400 mg (two 200 mg pills) by mouth every 6 hours. The most you should take each day is 1,200 mg (six 200 mg pills), unless your doctor has told you to take more. DISCHARGE: * Any discharge should be wiped away with a wet cotton ball as necessary. CARE ADVICE given per Ear - Otitis Externa Follow-Up Call (Adult) guideline CALL BACK IF: * Fever occurs * You become worse Referrals GO TO FACILITY UNDECIDED

## 2020-10-08 NOTE — Telephone Encounter (Signed)
It is okay to go back on mucinex if that helps. Most upper respiratory infections, even sinusitis and ear infections, are viral---so that may be why she hasn't quickly improved even after antibiotics.

## 2020-10-08 NOTE — Telephone Encounter (Signed)
Spoke to pt. She will do rhe Mucinex DM.

## 2020-10-09 NOTE — Telephone Encounter (Signed)
Pt left v/m;pt does not think she is any worse but pt does not think she is any better; pt still has head and chest congestion and cough. Pt will finish abx 10/10/20 for sinus or ear infection. Pt is supposed to go out of town this weekend and wants to know is there anything else that can be done? Pt request cb. Dr Silvio Pate is out of office this afternoon but will be back in AM. Sending note to Dr Manning Charity CMA and Webb Silversmith NP. [place]. Pt seen at [facility] on 10/03/20.

## 2020-10-10 MED ORDER — CEFUROXIME AXETIL 250 MG PO TABS: 250.0000 mg | ORAL_TABLET | Freq: Two times a day (BID) | ORAL | 0 refills | Status: DC

## 2020-10-10 NOTE — Telephone Encounter (Signed)
Please let her know that I sent a different antibiotic for her to try---to see if that helps more. I hope she has a nice trip

## 2020-10-10 NOTE — Addendum Note (Signed)
Addended by: Viviana Simpler I on: 10/10/2020 07:22 AM   Modules accepted: Orders

## 2020-10-10 NOTE — Telephone Encounter (Signed)
Spoke to pt

## 2020-10-16 ENCOUNTER — Other Ambulatory Visit: Payer: Self-pay

## 2020-10-16 NOTE — Progress Notes (Signed)
Opened in error

## 2020-11-01 ENCOUNTER — Ambulatory Visit: Payer: Medicare Other | Admitting: Dermatology

## 2020-11-01 ENCOUNTER — Other Ambulatory Visit: Payer: Self-pay

## 2020-11-01 DIAGNOSIS — L509 Urticaria, unspecified: Secondary | ICD-10-CM | POA: Diagnosis not present

## 2020-11-01 MED ORDER — OMALIZUMAB 150 MG/ML ~~LOC~~ SOSY
300.0000 mg | PREFILLED_SYRINGE | Freq: Once | SUBCUTANEOUS | Status: AC
  Administered 2020-11-01: 300 mg via SUBCUTANEOUS

## 2020-11-01 MED ORDER — OMALIZUMAB 150 MG/ML ~~LOC~~ SOSY: 300.0000 mg | PREFILLED_SYRINGE | Freq: Once | SUBCUTANEOUS | Status: DC

## 2020-11-01 NOTE — Progress Notes (Signed)
   Follow-Up Visit   Subjective  [name] is a 69 y.o. female who presents for the following: Follow-up (Urticaria follow up - Xolair injection today. She had a very mild breakout a couple weeks ago that only last about 2 hours. Her itching is much less since starting Xolair injections.).  The following portions of the chart were reviewed this encounter and updated as appropriate:   Tobacco  Allergies  Meds  Problems  Med Hx  Surg Hx  Fam Hx     Review of Systems:  No other skin or systemic complaints except as noted in HPI or Assessment and Plan.  Objective  Well appearing patient in no apparent distress; mood and affect are within normal limits.  A focused examination was performed including face, arms. Relevant physical exam findings are noted in the Assessment and Plan.  Objective  Trunk, extremities: Clear today   Assessment & Plan  Urticaria -chronic idiopathic urticaria Trunk, extremities  Chronic, not to goal  Month 5 of Xolair injections - Improved. Advised patient will continue injections through 12/2020.   Blood pressure: 11:15 - 134/76 11:45 - 129/79 12:00 - 109/71  Total time with patient today - 45 minutes  omalizumab Arvid Right) prefilled syringe 300 mg - Trunk, extremities  Return in about 6 weeks (around 12/10/2020).  I, Ashok Cordia, CMA, am acting as scribe for Sarina Ser, MD .  Documentation: I have reviewed the above documentation for accuracy and completeness, and I agree with the above.  Sarina Ser, MD

## 2020-11-05 ENCOUNTER — Ambulatory Visit
Admission: EM | Admit: 2020-11-05 | Discharge: 2020-11-05 | Disposition: A | Discharge status: Home / Self Care | Payer: Medicare Other | Attending: Family Medicine | Admitting: Family Medicine

## 2020-11-05 DIAGNOSIS — L509 Urticaria, unspecified: Secondary | ICD-10-CM | POA: Diagnosis not present

## 2020-11-05 MED ORDER — PREDNISONE 10 MG (21) PO TBPK: ORAL_TABLET | ORAL | 0 refills | Status: DC

## 2020-11-05 NOTE — ED Triage Notes (Addendum)
Pt presents to Urgent Care with c/o red, itchy eyelids and periorbital area since this AM. Has had this problem intermittently since having COVID injection in 12/2019. Pt also c/o scant eye drainage. No visual disturbance.
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
## 2020-11-05 NOTE — Discharge Instructions (Addendum)
Treating you with oral steroids. Take as prescribed Follow up with the dermatologist as needed  Benadryl as needed.

## 2020-11-07 ENCOUNTER — Encounter: Payer: Self-pay | Admitting: Dermatology

## 2020-11-13 ENCOUNTER — Ambulatory Visit (INDEPENDENT_AMBULATORY_CARE_PROVIDER_SITE_OTHER): Payer: Medicare Other | Admitting: Internal Medicine

## 2020-11-13 ENCOUNTER — Encounter: Payer: Self-pay | Admitting: Internal Medicine

## 2020-11-13 ENCOUNTER — Other Ambulatory Visit: Payer: Self-pay

## 2020-11-13 VITALS — BP 132/80 | HR 87 | Temp 97.5°F | Ht 64.5 in | Wt 154.0 lb

## 2020-11-13 DIAGNOSIS — Z0000 Encounter for general adult medical examination without abnormal findings: Secondary | ICD-10-CM | POA: Diagnosis not present

## 2020-11-13 DIAGNOSIS — L509 Urticaria, unspecified: Secondary | ICD-10-CM

## 2020-11-13 DIAGNOSIS — I700 Atherosclerosis of aorta: Secondary | ICD-10-CM | POA: Diagnosis not present

## 2020-11-13 DIAGNOSIS — K2100 Gastro-esophageal reflux disease with esophagitis, without bleeding: Secondary | ICD-10-CM

## 2020-11-13 DIAGNOSIS — F39 Unspecified mood [affective] disorder: Secondary | ICD-10-CM

## 2020-11-13 DIAGNOSIS — Z7189 Other specified counseling: Secondary | ICD-10-CM

## 2020-11-13 LAB — LIPID PANEL
Cholesterol: 285 mg/dL — ABNORMAL HIGH (ref 0–200)
HDL: 49.5 mg/dL (ref >39.00)
LDL Cholesterol: 197 mg/dL — ABNORMAL HIGH (ref 0–99)
NonHDL: 235.84
Total CHOL/HDL Ratio: 6
Triglycerides: 193 mg/dL — ABNORMAL HIGH (ref 0.0–149.0)
VLDL: 38.6 mg/dL (ref 0.0–40.0)

## 2020-11-13 LAB — COMPREHENSIVE METABOLIC PANEL
ALT: 23 U/L (ref 0–35)
AST: 17 U/L (ref 0–37)
Albumin: 4.1 g/dL (ref 3.5–5.2)
Alkaline Phosphatase: 67 U/L (ref 39–117)
BUN: 22 mg/dL (ref 6–23)
CO2: 34 mEq/L — ABNORMAL HIGH (ref 19–32)
Calcium: 10.6 mg/dL — ABNORMAL HIGH (ref 8.4–10.5)
Chloride: 100 mEq/L (ref 96–112)
Creatinine, Ser: 0.8 mg/dL (ref 0.40–1.20)
GFR: 74.44 mL/min (ref >60.00)
Glucose, Bld: 89 mg/dL (ref 70–99)
Potassium: 4.9 mEq/L (ref 3.5–5.1)
Sodium: 137 mEq/L (ref 135–145)
Total Bilirubin: 0.4 mg/dL (ref 0.2–1.2)
Total Protein: 6.7 g/dL (ref 6.0–8.3)

## 2020-11-13 LAB — CBC
HCT: 43.6 % (ref 36.0–46.0)
Hemoglobin: 14.7 g/dL (ref 12.0–15.0)
MCHC: 33.8 g/dL (ref 30.0–36.0)
MCV: 95.4 fl (ref 78.0–100.0)
Platelets: 291 10*3/uL (ref 150.0–400.0)
RBC: 4.57 Mil/uL (ref 3.87–5.11)
RDW: 13.5 % (ref 11.5–15.5)
WBC: 8.1 10*3/uL (ref 4.0–10.5)

## 2020-11-13 LAB — T4, FREE: Free T4: 0.9 ng/dL (ref 0.60–1.60)

## 2020-11-13 MED ORDER — MECLIZINE HCL 25 MG PO TABS: 25.0000 mg | ORAL_TABLET | Freq: Three times a day (TID) | ORAL | 5 refills | Status: AC | PRN

## 2020-11-13 MED ORDER — OMEPRAZOLE 20 MG PO CPDR: 20.0000 mg | DELAYED_RELEASE_CAPSULE | Freq: Every day | ORAL | 3 refills | Status: DC

## 2020-11-13 MED ORDER — ROSUVASTATIN CALCIUM 20 MG PO TABS: 20.0000 mg | ORAL_TABLET | ORAL | 3 refills | Status: DC

## 2020-11-13 NOTE — Assessment & Plan Note (Signed)
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

## 2020-11-13 NOTE — Assessment & Plan Note (Signed)
Has been statin intolerant---but will try weekly rosuvastatin 20mg 

## 2020-11-13 NOTE — Assessment & Plan Note (Signed)
Has had some dysphagia--so will start PPI Will need EGD if this persist

## 2020-11-13 NOTE — Progress Notes (Signed)
Hearing Screening   Method: Audiometry   125Hz  250Hz  500Hz  1000Hz  2000Hz  3000Hz  4000Hz  6000Hz  8000Hz   Right ear:   20 20 20  20     Left ear:   20 20 20  20     Vision Screening Comments: Appt at end of the month.

## 2020-11-13 NOTE — Assessment & Plan Note (Signed)
Continue on xolair Recent eye symptoms likely related--she will discuss with Dr Nehemiah Massed

## 2020-11-13 NOTE — Assessment & Plan Note (Signed)
See social history 

## 2020-11-13 NOTE — Assessment & Plan Note (Signed)
I have personally reviewed the Medicare Annual Wellness questionnaire and have noted 1. The patient's medical and social history 2. Their use of alcohol, tobacco or illicit drugs 3. Their current medications and supplements 4. The patient's functional ability including ADL's, fall risks, home safety risks and hearing or visual             impairment. 5. Diet and physical activities 6. Evidence for depression or mood disorders  The patients weight, height, BMI and visual acuity have been recorded in the chart I have made referrals, counseling and provided education to the patient based review of the above and I have provided the pt with a written personalized care plan for preventive services.  I have provided you with a copy of your personalized plan for preventive services. Please take the time to review along with your updated medication list.  Had flu vaccine No COVID due to reaction Discussed exercise and resistance Colon again in 2026 Mammogram recently--due again 8/21

## 2020-11-13 NOTE — Patient Instructions (Signed)
Start the omeprazole daily on an empty stomach for 4-6 weeks. If you have no ongoing swallowing problems, you can cut back to every other day.
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
## 2020-11-29 ENCOUNTER — Ambulatory Visit: Payer: Medicare Other | Admitting: Dermatology

## 2020-12-10 ENCOUNTER — Ambulatory Visit: Payer: Medicare Other | Admitting: Dermatology

## 2020-12-13 ENCOUNTER — Ambulatory Visit: Payer: Medicare Other | Admitting: Dermatology

## 2020-12-13 ENCOUNTER — Other Ambulatory Visit: Payer: Self-pay

## 2020-12-13 DIAGNOSIS — L239 Allergic contact dermatitis, unspecified cause: Secondary | ICD-10-CM

## 2020-12-13 DIAGNOSIS — L509 Urticaria, unspecified: Secondary | ICD-10-CM

## 2020-12-13 MED ORDER — TACROLIMUS 0.1 % EX OINT: TOPICAL_OINTMENT | Freq: Two times a day (BID) | CUTANEOUS | 0 refills | Status: AC

## 2020-12-13 MED ORDER — OMALIZUMAB 150 MG/ML ~~LOC~~ SOSY
150.0000 mg | PREFILLED_SYRINGE | Freq: Once | SUBCUTANEOUS | Status: AC
  Administered 2020-12-13: 150 mg via SUBCUTANEOUS

## 2020-12-13 NOTE — Progress Notes (Signed)
   Follow-Up Visit   Subjective  [name] is a 70 y.o. female who presents for the following: Follow-up (Urticaria follow up - Xolair injection once a month - no breakout in last month. She saw a pulmonologist at [facility] yesterday. She ordered labs and recommended antihistamines bid. She is have some irritation around eyes and she has been using [facility] 0.1% cream.).  The following portions of the chart were reviewed this encounter and updated as appropriate:   Tobacco  Allergies  Meds  Problems  Med Hx  Surg Hx  Fam Hx     Review of Systems:  No other skin or systemic complaints except as noted in HPI or Assessment and Plan.  Objective  Well appearing patient in no apparent distress; mood and affect are within normal limits.  A focused examination was performed including trunk, arms. Relevant physical exam findings are noted in the Assessment and Plan.  Objective  Trunk, extremities: Clear today  Objective  Bilateral infraorbital areas: Pinkness    Assessment & Plan  Urticaria Trunk, extremities Labs ordered through [facility]: Alpha Gal IgE, Immunoglobulin E, IgE Receptor Antibody, CBC, CMP, Sed Rate  Will continue Xolair injections through February - will decide at visit whether to give one injection or two  Continue antihistamines as prescribed by pulmonologist.  Blood pressure: 12:19  129/74 12.38  129/70 12.53  128/76  omalizumab (XOLAIR) prefilled syringe 150 mg - Trunk, extremities  omalizumab Arvid Right) prefilled syringe 150 mg - Trunk, extremities  tacrolimus (PROTOPIC) 0.1 % ointment - Trunk, extremities  Allergic dermatitis Bilateral infraorbital areas Start Protopic ointment bid to face around eyes prn  Ordered Medications: tacrolimus (PROTOPIC) 0.1 % ointment  Over 40 minutes spent with patient today in evaluation and treatment.  Return in about 1 month (around 01/13/2021).  I, Ashok Cordia, CMA, am acting as scribe for Sarina Ser, MD  .  Documentation: I have reviewed the above documentation for accuracy and completeness, and I agree with the above.  Sarina Ser, MD

## 2020-12-18 ENCOUNTER — Encounter: Payer: Self-pay | Admitting: Dermatology

## 2021-01-17 ENCOUNTER — Ambulatory Visit: Payer: Medicare Other | Admitting: Dermatology

## 2021-01-17 ENCOUNTER — Other Ambulatory Visit: Payer: Self-pay

## 2021-01-17 DIAGNOSIS — L509 Urticaria, unspecified: Secondary | ICD-10-CM

## 2021-01-17 DIAGNOSIS — L239 Allergic contact dermatitis, unspecified cause: Secondary | ICD-10-CM

## 2021-01-17 MED ORDER — OMALIZUMAB 150 MG/ML ~~LOC~~ SOSY
150.0000 mg | PREFILLED_SYRINGE | Freq: Once | SUBCUTANEOUS | Status: AC
  Administered 2021-01-17: 150 mg via SUBCUTANEOUS

## 2021-01-17 NOTE — Progress Notes (Signed)
   Follow-Up Visit   Subjective  [name] is a 70 y.o. female who presents for the following: Urticaria (Trunk, arms, legs, 101m /fu Xolair sq injections q month, Benadryl qhs, xyzal, pt did see allergist at [facility] Dr. Silverio Lay) and hx of ACD (Eyes, Tacrolimus, pt saw eye doctor and she had a chalazion )  The following portions of the chart were reviewed this encounter and updated as appropriate:   Tobacco  Allergies  Meds  Problems  Med Hx  Surg Hx  Fam Hx     Review of Systems:  No other skin or systemic complaints except as noted in HPI or Assessment and Plan.  Objective  Well appearing patient in no apparent distress; mood and affect are within normal limits.  A focused examination was performed including arms. Relevant physical exam findings are noted in the Assessment and Plan.  Objective  Trunk, extremities: Arms, trunk clear today  Objective  periocular: Clear today   Assessment & Plan    Urticaria -chronic idiopathic urticaria.  Severe.  Doing better on Xolair.  She saw an allergist recently (Dr. Silverio Lay at [facility]) but no other recommendations made at this time. Dr. Steffanie Dunn records were reviewed.  Alpha gal test was negative. Trunk, extremities Severe chronic idiopathic urticaria.  Improving on Xolair systemic shots   Note/labs from Dr. Silverio Lay 12/12/2020 reviewed Alpha gal negative  Cont antihistamines as prescribed by pulmonologist Benadryl qhs, Xyzal qam  Cont Xolair 150mg  x 2 sq injections   Xolair Lot [id number] exp 09/2021 150mg  sq injections x 2 to L and R arm  Discussed whether we could decrease her dose to 1 shot next time or taper her off over time.  Advised we will plan to just seen in approximately a month and see how her symptoms have been and discuss at that time whether we will keep her current 300 mg (2 shots) dosing or decrease to 150 (One-Shot).  BP before Xolair injection, 12:10 - 118/70 BP right after Xolair injections, 12:15  -125/67 BP 15 mins after Xolair injections 12:30- 114/69 BP 30 mins after Xolair injections 12:55 - 116/68  omalizumab (XOLAIR) prefilled syringe 150 mg - Trunk, extremities  omalizumab Arvid Right) prefilled syringe 150 mg - Trunk, extremities  Allergic dermatitis periocular With hx of Chalazion, clear today, observe  Other Related Medications tacrolimus (PROTOPIC) 0.1 % ointment  Over 45 minutes spent in evaluation and treatment of the patient today.  Return in about 1 month (around 02/14/2021) for Urticaria, Xolair.  I, Othelia Pulling, RMA, am acting as scribe for Sarina Ser, MD .  Documentation: I have reviewed the above documentation for accuracy and completeness, and I agree with the above.  Sarina Ser, MD

## 2021-01-19 ENCOUNTER — Encounter: Payer: Self-pay | Admitting: Dermatology

## 2021-02-15 ENCOUNTER — Encounter: Payer: Self-pay | Admitting: Internal Medicine

## 2021-02-20 ENCOUNTER — Other Ambulatory Visit: Payer: Self-pay

## 2021-02-20 ENCOUNTER — Ambulatory Visit: Payer: Medicare Other | Admitting: Dermatology

## 2021-02-20 DIAGNOSIS — L501 Idiopathic urticaria: Secondary | ICD-10-CM | POA: Diagnosis not present

## 2021-02-20 DIAGNOSIS — L2389 Allergic contact dermatitis due to other agents: Secondary | ICD-10-CM | POA: Diagnosis not present

## 2021-02-20 DIAGNOSIS — L509 Urticaria, unspecified: Secondary | ICD-10-CM

## 2021-02-20 DIAGNOSIS — L300 Nummular dermatitis: Secondary | ICD-10-CM

## 2021-02-20 MED ORDER — OMALIZUMAB 150 MG/ML ~~LOC~~ SOSY
150.0000 mg | PREFILLED_SYRINGE | Freq: Once | SUBCUTANEOUS | Status: AC
  Administered 2021-02-20: 150 mg via SUBCUTANEOUS

## 2021-02-20 MED ORDER — EPINEPHRINE 0.3 MG/0.3ML IJ SOAJ: 0.3000 mg | INTRAMUSCULAR | 1 refills | Status: AC | PRN

## 2021-02-20 MED ORDER — MOMETASONE FUROATE 0.1 % EX CREA: 1.0000 "application " | TOPICAL_CREAM | Freq: Every day | CUTANEOUS | 0 refills | Status: AC | PRN

## 2021-02-20 NOTE — Progress Notes (Signed)
   Follow-Up Visit   Subjective  [name] is a 70 y.o. female who presents for the following: Urticaria (Pt states that she has been doing good. She did notice some new spots under her left arm last week but states that they have not been itching. ) and Eczema (Pt has a few new spots of eczema on her chest. She has been treating with hydrocortisone cream that she had at home. ).  The following portions of the chart were reviewed this encounter and updated as appropriate:  Tobacco  Allergies  Meds  Problems  Med Hx  Surg Hx  Fam Hx     Review of Systems: No other skin or systemic complaints except as noted in HPI or Assessment and Plan.  Objective  Well appearing patient in no apparent distress; mood and affect are within normal limits.  A focused examination was performed including face, neck, arms, chest. Relevant physical exam findings are noted in the Assessment and Plan.  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  Xolair 150 mg injected today in L arm.   Lot: [id number] Exp: 09/2021  May inject another 150 mg xolair next week if pt feels she needs it. 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  Nummular dermatitis Left Breast Start Mometasone cream. Apply to affected areas qd PRN.  mometasone (ELOCON) 0.1 % cream - Left Breast Atopic dermatitis (eczema) is a chronic, relapsing, pruritic condition that can significantly affect quality of life. It is often associated with allergic  rhinitis and/or asthma and can require treatment with topical medications, phototherapy, or in severe cases a biologic medication called Dupixent in older children and adults.   Greater than 40 minutes spent with patient in evaluation and treatment today. Return in about 1 month (around 03/23/2021).   IHarriett Sine, CMA, am acting as scribe for Sarina Ser, MD.  Documentation: I have reviewed the above documentation for accuracy and completeness, and I agree with the above.  Sarina Ser, MD

## 2021-02-21 ENCOUNTER — Ambulatory Visit (INDEPENDENT_AMBULATORY_CARE_PROVIDER_SITE_OTHER): Payer: Medicare Other

## 2021-02-21 ENCOUNTER — Encounter: Payer: Self-pay | Admitting: Dermatology

## 2021-02-21 DIAGNOSIS — L509 Urticaria, unspecified: Secondary | ICD-10-CM

## 2021-02-21 NOTE — Progress Notes (Signed)
Patient here today for second Xolair injection. Patient states she woke up this morning broken out again itchy and burning hives. Patient to continue antihistamine once daily. She will call if she needs refills.   Per Dr. Nehemiah Massed we should continue to do both injections next month then discuss decreasing later.   Xolair 150mg  injected into R arm. Patient tolerated well.   HWT:[id number] EXP: NOV 2022  1st Blood pressure reading before injection: 131/76 Pulse 95  2nd blood pressure reading after injection: 122/75  3rd blood pressure reading 15 minutes after injection: 123/75 PULSE 93   4th blood pressure reading 30 minutes after injection: 115/71 PULSE 93

## 2021-03-20 ENCOUNTER — Other Ambulatory Visit: Payer: Self-pay

## 2021-03-20 ENCOUNTER — Encounter: Payer: Self-pay | Admitting: Dermatology

## 2021-03-20 ENCOUNTER — Telehealth: Payer: Self-pay

## 2021-03-20 ENCOUNTER — Ambulatory Visit: Payer: Medicare Other | Admitting: Dermatology

## 2021-03-20 DIAGNOSIS — L509 Urticaria, unspecified: Secondary | ICD-10-CM

## 2021-03-20 DIAGNOSIS — L501 Idiopathic urticaria: Secondary | ICD-10-CM

## 2021-03-20 MED ORDER — OMALIZUMAB 150 MG/ML ~~LOC~~ SOSY
150.0000 mg | PREFILLED_SYRINGE | Freq: Once | SUBCUTANEOUS | Status: AC
  Administered 2021-03-20: 150 mg via SUBCUTANEOUS

## 2021-03-20 MED ORDER — MONTELUKAST SODIUM 10 MG PO TABS: 10.0000 mg | ORAL_TABLET | Freq: Every day | ORAL | 4 refills | Status: DC

## 2021-03-20 MED ORDER — LEVOCETIRIZINE DIHYDROCHLORIDE 5 MG PO TABS: 5.0000 mg | ORAL_TABLET | Freq: Every evening | ORAL | 4 refills | Status: DC

## 2021-03-20 NOTE — Patient Instructions (Addendum)
If you have any questions or concerns for your doctor, please call our main line at [phone] and press option 4 to reach your doctor's medical assistant. If no one answers, please leave a voicemail as directed and we will return your call as soon as possible. Messages left after 4 pm will be answered the following business day.   You may also send [place] a message via [place]. We typically respond to MyChart messages within 1-2 business days.  For prescription refills, please ask your pharmacy to contact our office. Our fax number is [phone].  If you have an urgent issue when the clinic is closed that cannot wait until the next business day, you can page your doctor at the number below.    Please note that while we do our best to be available for urgent issues outside of office hours, we are not available 24/7.   If you have an urgent issue and are unable to reach [place], you may choose to seek medical care at your doctor's office, retail clinic, urgent care center, or emergency room.  If you have a medical emergency, please immediately call 911 or go to the emergency department.  Pager Numbers  - Dr. Nehemiah Massed: [phone]  - Dr. Laurence Ferrari: [phone]  - Dr. Nicole Kindred: [phone]  In the event of inclement weather, please call our main line at [phone] for an update on the status of any delays or closures.  Dermatology Medication Tips: Please keep the boxes that topical medications come in in order to help keep track of the instructions about where and how to use these. Pharmacies typically print the medication instructions only on the boxes and not directly on the medication tubes.   If your medication is too expensive, please contact our office at [phone] option 4 or send [place] a message through [place].   We are unable to tell what your co-pay for medications will be in advance as this is different depending on your insurance coverage. However, we may be able to find a substitute  medication at lower cost or fill out paperwork to get insurance to cover a needed medication.   If a prior authorization is required to get your medication covered by your insurance company, please allow [place] 1-2 business days to complete this process.  Drug prices often vary depending on where the prescription is filled and some pharmacies may offer cheaper prices.  The website www.goodrx.com contains coupons for medications through different pharmacies. The prices here do not account for what the cost may be with help from insurance (it may be cheaper with your insurance), but the website can give you the price if you did not use any insurance.  - You can print the associated coupon and take it with your prescription to the pharmacy.  - You may also stop by our office during regular business hours and pick up a GoodRx coupon card.  - If you need your prescription sent electronically to a different pharmacy, notify our office through [facility] or by phone at [phone] option 4.    Start Allegra 180mg  1 pill in the am Change Xyzal to nightly at bedtime Restart Singulair once a day around mid day

## 2021-03-20 NOTE — Telephone Encounter (Signed)
Left pt message advising we put in a referral to [facility] and they would be calling her to set up appointment.  Advised pt if she has not heard from [facility] in a week to give [place] a call so we can f/u with [facility]./sh

## 2021-03-20 NOTE — Progress Notes (Signed)
   Follow-Up Visit   Subjective  [name] is a 70 y.o. female who presents for the following: Urticaria (Trunk, extremities, Xolair, antihistamines Benadryl qhs, Xyzal qam, pt has been flaring since last visit, half days a lot of itching and half days mild itching, they come up in the am and subside in the late afternoon, she feels they flare around 5-6am).  The following portions of the chart were reviewed this encounter and updated as appropriate:   Tobacco  Allergies  Meds  Problems  Med Hx  Surg Hx  Fam Hx     Review of Systems:  No other skin or systemic complaints except as noted in HPI or Assessment and Plan.  Objective  Well appearing patient in no apparent distress; mood and affect are within normal limits.  A focused examination was performed including face, arms. Relevant physical exam findings are noted in the Assessment and Plan.  Objective  trunk extremities: Urticarial patches face, arms, legs, trunk   Assessment & Plan  Urticaria trunk extremities Chronic idiopathic urticaria with active flare  Pt has been seen at [facility] with no other recommendations for treatment Alpha Gal Testing negative Contact Dermatitis with positive patch testing to Cobalt Dichloride, Cl+M3- Isothiazolinone, Gold Sodium Thiosulfate  Cont Xolair 300mg  (150mg  x 2) sq injections q month Xolair 150mg  x 2 sq injection today to L upper arm and R upper arm, Lot #[id number] 12/2021 Start Allegra 180mg  1 po qam Change Xyzal to 1 po qhs Restart Singulair 10mg  1 po qd  BP before Xolair injection 11:40 - 140/76 BP right after Xolair injection 11:56 - 126/67 BP 15 mins after Xolair injection - 12:12 - 132/71 BP 30 mins after Xolair injection -  12:27 - 122/65  Discussed in detail with pt.  Will refer to [facility] for 2nd opinion to see if any other recommendations.  Over 45 minutes spent with pt in evaluation and treatment today.  montelukast (SINGULAIR) 10 MG tablet - trunk  extremities  levocetirizine (XYZAL) 5 MG tablet - trunk extremities  omalizumab Arvid Right) prefilled syringe 150 mg - trunk extremities  omalizumab Arvid Right) prefilled syringe 150 mg - trunk extremities  Other Related Procedures Ambulatory referral to Dermatology  Return in about 1 month (around 04/19/2021) for Urticaria.  I, Othelia Pulling, RMA, am acting as scribe for Sarina Ser, MD .  Documentation: I have reviewed the above documentation for accuracy and completeness, and I agree with the above.  Sarina Ser, MD

## 2021-04-30 ENCOUNTER — Other Ambulatory Visit: Payer: Self-pay | Admitting: Dermatology

## 2021-05-01 ENCOUNTER — Other Ambulatory Visit: Payer: Self-pay

## 2021-05-01 ENCOUNTER — Other Ambulatory Visit: Payer: Self-pay | Admitting: Dermatology

## 2021-05-01 ENCOUNTER — Ambulatory Visit: Payer: Medicare Other | Admitting: Dermatology

## 2021-05-01 DIAGNOSIS — L501 Idiopathic urticaria: Secondary | ICD-10-CM | POA: Diagnosis not present

## 2021-05-01 DIAGNOSIS — L509 Urticaria, unspecified: Secondary | ICD-10-CM

## 2021-05-01 DIAGNOSIS — L230 Allergic contact dermatitis due to metals: Secondary | ICD-10-CM | POA: Diagnosis not present

## 2021-05-01 DIAGNOSIS — L239 Allergic contact dermatitis, unspecified cause: Secondary | ICD-10-CM

## 2021-05-01 MED ORDER — OMALIZUMAB 150 MG ~~LOC~~ SOLR
300.0000 mg | SUBCUTANEOUS | Status: DC
  Administered 2021-05-01: 300 mg via SUBCUTANEOUS

## 2021-05-01 NOTE — Progress Notes (Signed)
   Follow-Up Visit   Subjective  [name] is a 70 y.o. female who presents for the following: Follow-up (Urticaria follow up - Xolair does not seem to be helping control as well as it had in the past. She has been awakened 3 times in the last month with rash and itching around her eyes. She has seen Dr. Lennox Grumbles at [facility] and is having some labs drawn for him today. He recommended extra patch testing but the doctor that does that testing reviewed her notes and did not think the test would be helpful. She is taking Allegra qam, Singulair in the afternoon, Xyzal and Benadryl at night.).  The following portions of the chart were reviewed this encounter and updated as appropriate:   Tobacco  Allergies  Meds  Problems  Med Hx  Surg Hx  Fam Hx      Review of Systems:  No other skin or systemic complaints except as noted in HPI or Assessment and Plan.  Objective  Well appearing patient in no apparent distress; mood and affect are within normal limits.  A focused examination was performed including face, trunk, extremities. Relevant physical exam findings are noted in the Assessment and Plan.  Face, trunk, extremities Some mild pink edema around eyes. Some dermatographism.   Assessment & Plan  Chronic idiopathic urticaria Face, trunk, extremities Chronic and persistent  Continue Xolair injections once a month. Continue Allegra qam, Singulair in the afternoon, Xyzal and Benadryl qhs.  Advised her to keep her appointment with Dr. Lennox Grumbles at [facility].  BP before Xolair injection, 3:45 - 132/66 BP right after Xolair injections, 4:20 -118/63 BP 15 mins after Xolair injections 4:35 - 126/74 BP 30 mins after Xolair injections 4:50 - 123/74  omalizumab (XOLAIR) injection 300 mg - Face, trunk, extremities  Related Medications montelukast (SINGULAIR) 10 MG tablet Take 1 tablet (10 mg total) by mouth daily.  levocetirizine (XYZAL) 5 MG tablet Take 1 tablet (5 mg total) by mouth every  evening.  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   Over 45 minutes spent in evaluation and treatment of the patient today.  Return in about 1 month (around 05/31/2021) for Xolair injection.  I, Ashok Cordia, CMA, am acting as scribe for Sarina Ser, MD .  Documentation: I have reviewed the above documentation for accuracy and completeness, and I agree with the above.  Sarina Ser, MD

## 2021-05-01 NOTE — Patient Instructions (Signed)

## 2021-05-02 ENCOUNTER — Encounter: Payer: Self-pay | Admitting: Dermatology

## 2021-05-13 ENCOUNTER — Telehealth: Payer: Self-pay

## 2021-05-13 MED ORDER — PREDNISONE 20 MG PO TABS: 20.0000 mg | ORAL_TABLET | ORAL | 0 refills | Status: AC

## 2021-05-13 NOTE — Telephone Encounter (Signed)
Prednisone sent in and patient advised.  Patient was at PCP for a check up and started talking to doctor about constant hives break out. PCP gave patient information for Dr. Deneen Harts who specializes in Allergy and Immunology. He is located in [place]. Patient called their office today and is a candidate to be seen by the doctor. [name] does need a referral to travel to see this physician. She has asked if you can place this referral for her since you know and we have all of her history and background since hives started.   [facility] [address] [place]  [place]: [phone] F: [phone]

## 2021-05-13 NOTE — Telephone Encounter (Signed)
Patient called regarding current flare of hives. She is asking for some sort of relief with a steroid. Patient is asking for a Prednisone if you will approve this.

## 2021-05-14 NOTE — Telephone Encounter (Signed)
Left message for patient to return my call.

## 2021-05-15 NOTE — Telephone Encounter (Signed)
Patient advised of information per Dr. Nehemiah Massed and office notes faxed.

## 2021-05-20 ENCOUNTER — Telehealth: Payer: Self-pay

## 2021-05-20 NOTE — Telephone Encounter (Signed)
Patient called. She spoke with Dr. Kendell Bane office regarding a referral we sent. She was advised that the referral needs to say mast cell somewhere in it before they will schedule her appointment/hd

## 2021-05-22 NOTE — Telephone Encounter (Signed)
Fax completed today

## 2021-05-30 ENCOUNTER — Ambulatory Visit: Payer: Medicare Other

## 2021-05-30 ENCOUNTER — Other Ambulatory Visit: Payer: Self-pay

## 2021-05-30 DIAGNOSIS — L509 Urticaria, unspecified: Secondary | ICD-10-CM | POA: Diagnosis not present

## 2021-05-30 MED ORDER — OMALIZUMAB 150 MG/ML ~~LOC~~ SOSY
300.0000 mg | PREFILLED_SYRINGE | Freq: Once | SUBCUTANEOUS | Status: AC
  Administered 2021-05-30: 300 mg via SUBCUTANEOUS

## 2021-05-30 NOTE — Progress Notes (Signed)
Pt here for Xolair injections. 150 mg injected into each arm for total of 300 mg. Pt tolerated well.    BP before Xolair injection - 115/67 BP right after Xolair injections - 124/70 BP 15 mins after Xolair injections - 122/75 BP 30 mins after Xolair injections - 128/76  Lot: [id number] Exp: 07/2022

## 2021-06-08 ENCOUNTER — Other Ambulatory Visit: Payer: Self-pay | Admitting: Internal Medicine

## 2021-07-01 ENCOUNTER — Other Ambulatory Visit: Payer: Self-pay

## 2021-07-01 ENCOUNTER — Ambulatory Visit (INDEPENDENT_AMBULATORY_CARE_PROVIDER_SITE_OTHER): Payer: Medicare Other | Admitting: Dermatology

## 2021-07-01 DIAGNOSIS — L501 Idiopathic urticaria: Secondary | ICD-10-CM | POA: Diagnosis not present

## 2021-07-01 DIAGNOSIS — L509 Urticaria, unspecified: Secondary | ICD-10-CM

## 2021-07-01 MED ORDER — OMALIZUMAB 150 MG/ML ~~LOC~~ SOSY
300.0000 mg | PREFILLED_SYRINGE | Freq: Once | SUBCUTANEOUS | Status: AC
  Administered 2021-07-01: 300 mg via SUBCUTANEOUS

## 2021-07-01 NOTE — Patient Instructions (Signed)

## 2021-07-01 NOTE — Progress Notes (Signed)
   Follow-Up Visit   Subjective  [name] is a 70 y.o. female who presents for the following: Follow-up (Patient here today for Xolair injections. Patient has not been taking Singulair. She is taking Allegra in the morning, Zyrtec and Benadryl at bedtime. She has not had any breakouts. ).  Patient does have a circular rash area at the left axilla, present for about 3 weeks but no itch and nothing similar to her hives.   The following portions of the chart were reviewed this encounter and updated as appropriate:   Tobacco  Allergies  Meds  Problems  Med Hx  Surg Hx  Fam Hx     Review of Systems:  No other skin or systemic complaints except as noted in HPI or Assessment and Plan.  Objective  Well appearing patient in no apparent distress; mood and affect are within normal limits.  A focused examination was performed including face, arms, axilla. Relevant physical exam findings are noted in the Assessment and Plan.  Chest - Medial [facility]) Urticarial plaque at left axilla   Assessment & Plan  Urticaria -chronic idiopathic urticaria - Severe and recently flared but now calmer on Xolair and prednisone. Chest - Medial (Center)  BP before Xolair injection - 119/70 BP right after Xolair injections - 127/69  BP 15 mins after Xolair injections - 113/69 BP 30 mins after Xolair injections - 116/61  Patient scheduled in November with immunologist in [place].   Continue Allegra QAM Continue Xyzal and Benadryl QHS Continue Xolair '300mg'$  every month Restart Singulair daily Xolair '300mg'$  injected today to bilateral upper arms  Keep appointment with immunologist in [place]  Related Medications levocetirizine (XYZAL) 5 MG tablet Take 1 tablet (5 mg total) by mouth every evening.  montelukast (SINGULAIR) 10 MG tablet TAKE 1 TABLET BY MOUTH EVERY DAY  omalizumab (XOLAIR) injection 300 mg omalizumab (XOLAIR) prefilled syringe 300 mg  Patient seen in the office for at  least 45 minutes for evaluation and treatment.  Return in about 4 weeks (around 07/29/2021) for Xolair.  Graciella Belton, RMA, am acting as scribe for Sarina Ser, MD . Documentation: I have reviewed the above documentation for accuracy and completeness, and I agree with the above.  Sarina Ser, MD

## 2021-07-02 ENCOUNTER — Encounter: Payer: Self-pay | Admitting: Dermatology

## 2021-07-14 ENCOUNTER — Other Ambulatory Visit: Payer: Self-pay | Admitting: Dermatology

## 2021-07-15 ENCOUNTER — Other Ambulatory Visit: Payer: Self-pay | Admitting: Dermatology

## 2021-07-15 DIAGNOSIS — L509 Urticaria, unspecified: Secondary | ICD-10-CM

## 2021-08-01 ENCOUNTER — Other Ambulatory Visit: Payer: Self-pay

## 2021-08-01 ENCOUNTER — Ambulatory Visit: Payer: Medicare Other | Admitting: Dermatology

## 2021-08-01 DIAGNOSIS — L821 Other seborrheic keratosis: Secondary | ICD-10-CM | POA: Diagnosis not present

## 2021-08-01 DIAGNOSIS — L501 Idiopathic urticaria: Secondary | ICD-10-CM

## 2021-08-01 DIAGNOSIS — L509 Urticaria, unspecified: Secondary | ICD-10-CM

## 2021-08-01 DIAGNOSIS — L820 Inflamed seborrheic keratosis: Secondary | ICD-10-CM

## 2021-08-01 MED ORDER — OMALIZUMAB 150 MG/ML ~~LOC~~ SOSY
300.0000 mg | PREFILLED_SYRINGE | Freq: Once | SUBCUTANEOUS | Status: AC
  Administered 2021-08-01: 300 mg via SUBCUTANEOUS

## 2021-08-01 NOTE — Patient Instructions (Signed)

## 2021-08-01 NOTE — Progress Notes (Signed)
   Follow-Up Visit   Subjective  [name] is a 70 y.o. female who presents for the following: Follow-up (Urticaria 1 month follow up - Xolair injections).  The following portions of the chart were reviewed this encounter and updated as appropriate:   Tobacco  Allergies  Meds  Problems  Med Hx  Surg Hx  Fam Hx     Review of Systems:  No other skin or systemic complaints except as noted in HPI or Assessment and Plan.  Objective  Well appearing patient in no apparent distress; mood and affect are within normal limits.  A focused examination was performed including arms, legs, trunk. Relevant physical exam findings are noted in the Assessment and Plan.  Right Lower Back Erythematous keratotic or waxy stuck-on papule or plaque.    Assessment & Plan  Urticaria  Urticaria -chronic idiopathic urticaria - Severe and recently flared but patient doing much better now  after recent severe flare requiring prednisone   BP before Xolair injection - 145/81 BP 15 mins after Xolair injections - 137/82 BP 30 mins after Xolair injections - 130/78   Patient scheduled in November with immunologist in [place].    Continue Allegra QAM Continue Xyzal and Benadryl QHS Continue Xolair '300mg'$  every month Restart Singulair daily Xolair '300mg'$  injected today to bilateral upper arms   omalizumab (XOLAIR) prefilled syringe 300 mg  Related Medications montelukast (SINGULAIR) 10 MG tablet TAKE 1 TABLET BY MOUTH EVERY DAY  omalizumab (XOLAIR) injection 300 mg  levocetirizine (XYZAL) 5 MG tablet TAKE 1 TABLET BY MOUTH EVERY DAY IN THE EVENING  Inflamed seborrheic keratosis Right Lower Back  Destruction of lesion - Right Lower Back Complexity: simple   Destruction method: cryotherapy   Informed consent: discussed and consent obtained   Timeout:  patient name, date of birth, surgical site, and procedure verified Lesion destroyed using liquid nitrogen: Yes   Region frozen until ice  ball extended beyond lesion: Yes   Outcome: patient tolerated procedure well with no complications   Post-procedure details: wound care instructions given    Seborrheic Keratoses - Stuck-on, waxy, tan-brown papules and/or plaques  - Benign-appearing - Discussed benign etiology and prognosis. - Observe - Call for any changes  At least 45 minutes spent in evaluation and treatment of the pt today.  Return in about 1 month (around 08/31/2021) for Xolair injections.  I, Ashok Cordia, CMA, am acting as scribe for Sarina Ser, MD . Documentation: I have reviewed the above documentation for accuracy and completeness, and I agree with the above.  Sarina Ser, MD

## 2021-08-06 ENCOUNTER — Encounter: Payer: Self-pay | Admitting: Dermatology

## 2021-08-27 ENCOUNTER — Other Ambulatory Visit: Payer: Self-pay | Admitting: Dermatology

## 2021-09-05 ENCOUNTER — Other Ambulatory Visit: Payer: Self-pay

## 2021-09-05 ENCOUNTER — Ambulatory Visit: Payer: Medicare Other | Admitting: Dermatology

## 2021-09-05 DIAGNOSIS — L501 Idiopathic urticaria: Secondary | ICD-10-CM | POA: Diagnosis not present

## 2021-09-05 DIAGNOSIS — R21 Rash and other nonspecific skin eruption: Secondary | ICD-10-CM

## 2021-09-05 DIAGNOSIS — L509 Urticaria, unspecified: Secondary | ICD-10-CM

## 2021-09-05 MED ORDER — DOXYCYCLINE HYCLATE 100 MG PO TABS: 100.0000 mg | ORAL_TABLET | Freq: Two times a day (BID) | ORAL | 0 refills | Status: AC

## 2021-09-05 MED ORDER — OMALIZUMAB 150 MG/ML ~~LOC~~ SOSY: 300.0000 mg | PREFILLED_SYRINGE | Freq: Once | SUBCUTANEOUS | Status: DC

## 2021-09-05 MED ORDER — OMALIZUMAB 150 MG/ML ~~LOC~~ SOSY
300.0000 mg | PREFILLED_SYRINGE | Freq: Once | SUBCUTANEOUS | Status: AC
  Administered 2021-09-05: 300 mg via SUBCUTANEOUS

## 2021-09-05 NOTE — Patient Instructions (Signed)

## 2021-09-05 NOTE — Progress Notes (Signed)
   Follow-Up Visit   Subjective  [name] is a 70 y.o. female who presents for the following: Follow-up (1 month urticaria follow up - Xolair injections - no flares in the last month) and Other (Pink patch of left axilla. It has been there since the spring and has gotten bigger. It does not itch or burn. She does not recall any tick bites and feels fine.).  The following portions of the chart were reviewed this encounter and updated as appropriate:   Tobacco  Allergies  Meds  Problems  Med Hx  Surg Hx  Fam Hx     Review of Systems:  No other skin or systemic complaints except as noted in HPI or Assessment and Plan.  Objective  Well appearing patient in no apparent distress; mood and affect are within normal limits.  A focused examination was performed including trunk, arms, face. Relevant physical exam findings are noted in the Assessment and Plan.  Clear today  Left Axilla Annular macular pinkness      Assessment & Plan  Urticaria  Urticaria -chronic idiopathic urticaria - Severe and recently flared but patient doing much better now  after recent severe flare requiring prednisone. No flare this past month.   BP before Xolair injection - 129/73 BP 15 mins after Xolair injections - 114/64 BP 30 mins after Xolair injections - 125/69   Patient scheduled in November with immunologist in [place].    Continue Allegra QAM Continue Xyzal and Benadryl QHS Continue Xolair 300mg  every month Restart Singulair daily Xolair 300mg  injected today to bilateral upper arms   omalizumab (XOLAIR) prefilled syringe 300 mg  Related Medications montelukast (SINGULAIR) 10 MG tablet TAKE 1 TABLET BY MOUTH EVERY DAY  levocetirizine (XYZAL) 5 MG tablet TAKE 1 TABLET BY MOUTH EVERY DAY IN THE EVENING  Rash Left Axilla Hypersensitivity reaction Possible Lyme Disease Discussed prophylactically treating for possibility of Lyme or similar tick borne disease.  Pt wants to do  this.  doxycycline (VIBRA-TABS) 100 MG tablet - Left Axilla Take 1 tablet (100 mg total) by mouth 2 (two) times daily.  45 minutes visit with patient today.  If Pt does not find another derm or allergist to treat her in [place] area where she recently moved, she will return here -- Return in about 1 month (around 10/06/2021) for Xolair injection.  Documentation: I have reviewed the above documentation for accuracy and completeness, and I agree with the above.  Sarina Ser, MD

## 2021-09-09 ENCOUNTER — Encounter: Payer: Self-pay | Admitting: Dermatology

## 2021-10-03 ENCOUNTER — Telehealth: Payer: Self-pay | Admitting: Dermatology

## 2021-10-03 DIAGNOSIS — R21 Rash and other nonspecific skin eruption: Secondary | ICD-10-CM

## 2021-10-09 ENCOUNTER — Other Ambulatory Visit: Payer: Self-pay

## 2021-10-09 ENCOUNTER — Ambulatory Visit: Payer: Medicare Other | Admitting: Dermatology

## 2021-10-09 DIAGNOSIS — L509 Urticaria, unspecified: Secondary | ICD-10-CM

## 2021-10-09 DIAGNOSIS — L501 Idiopathic urticaria: Secondary | ICD-10-CM

## 2021-10-09 DIAGNOSIS — R21 Rash and other nonspecific skin eruption: Secondary | ICD-10-CM

## 2021-10-09 MED ORDER — OMALIZUMAB 150 MG/ML ~~LOC~~ SOSY
300.0000 mg | PREFILLED_SYRINGE | Freq: Once | SUBCUTANEOUS | Status: AC
  Administered 2021-10-09: 300 mg via SUBCUTANEOUS

## 2021-10-09 NOTE — Progress Notes (Signed)
   Follow-Up Visit   Subjective  [name] is a 70 y.o. female who presents for the following: Follow-up (Urticaria follow up - Xolair injections).  The following portions of the chart were reviewed this encounter and updated as appropriate:   Tobacco  Allergies  Meds  Problems  Med Hx  Surg Hx  Fam Hx     Review of Systems:  No other skin or systemic complaints except as noted in HPI or Assessment and Plan.  Objective  Well appearing patient in no apparent distress; mood and affect are within normal limits.  A focused examination was performed including arms. Relevant physical exam findings are noted in the Assessment and Plan.  Left Axilla Pink patch   Assessment & Plan   Urticaria Urticaria -chronic idiopathic urticaria - Severe and recently flared but patient doing much better now  after recent severe flare requiring prednisone. No flare this past month.   BP before Xolair injection - 134/68 BP 15 mins after Xolair injections - 127/76 BP 30 mins after Xolair injections - 122/81   Patient scheduled in November with immunologist in [place].  She has moved to [place], [place] area and will try to follow up with doctor there if continued Xolair injections needed.   Continue Allegra QAM Continue Xolair 300mg  every month Restart Singulair daily Xolair 300mg  injected today to bilateral upper arms   May taper off or stop xyzal and benadryl.  Continue Xolair injections monthly for now.  omalizumab Arvid Right) prefilled syringe 300 mg Related Medications montelukast (SINGULAIR) 10 MG tablet TAKE 1 TABLET BY MOUTH EVERY DAY levocetirizine (XYZAL) 5 MG tablet TAKE 1 TABLET BY MOUTH EVERY DAY IN THE EVENING  Rash - Clinically consistent with Hypersensitivity reaction Empirically treated with Doxycycline for remote possibility of Lyme disease - some improvement today, but not clear Left Axilla She finished a course of doxycycline. She still has no symptoms of Lyme  Disease. Rash is asymptomatic Observe.  At least 45 minutes spent in evaluation and treatment of the patient today.  Return in about 1 month (around 11/08/2021) for Follow up.  I, Ashok Cordia, CMA, am acting as scribe for Sarina Ser, MD . Documentation: I have reviewed the above documentation for accuracy and completeness, and I agree with the above.  Sarina Ser, MD

## 2021-10-09 NOTE — Patient Instructions (Signed)

## 2021-10-14 ENCOUNTER — Encounter: Payer: Self-pay | Admitting: Dermatology

## 2021-10-14 ENCOUNTER — Other Ambulatory Visit: Payer: Self-pay | Admitting: Dermatology

## 2021-10-28 ENCOUNTER — Encounter: Payer: Self-pay | Admitting: Internal Medicine

## 2021-10-28 MED ORDER — ROSUVASTATIN CALCIUM 20 MG PO TABS: 20.0000 mg | ORAL_TABLET | ORAL | 3 refills | Status: DC

## 2021-10-28 NOTE — Telephone Encounter (Signed)
Rx sent electronically.  

## 2021-10-28 NOTE — Telephone Encounter (Signed)
  Encourage patient to contact the pharmacy for refills or they can request refills through [place]:  Please schedule appointment if longer than 1 year  NEXT APPOINTMENT DATE:  MEDICATION:rosuvastatin (CRESTOR) 20 MG tablet   Is the patient out of medication?   [place] ([facility]) -   Let patient know to contact pharmacy at the end of the day to make sure medication is ready.  Please notify patient to allow 48-72 hours to process  CLINICAL FILLS OUT ALL BELOW:   LAST REFILL:  QTY:  REFILL DATE:    OTHER COMMENTS:    Okay for refill?  Please advise

## 2021-10-30 ENCOUNTER — Telehealth: Payer: Self-pay | Admitting: Internal Medicine

## 2021-10-30 NOTE — Telephone Encounter (Signed)
Okay noted

## 2021-10-30 NOTE — Telephone Encounter (Signed)
Updated allergy list and will forward to Dr Silvio Pate for [facility].

## 2021-10-30 NOTE — Telephone Encounter (Signed)
Courtney with [facility] called stating that pt is allergic to medication rosuvastatin (CRESTOR) 20 MG tablet.

## 2021-11-05 ENCOUNTER — Other Ambulatory Visit: Payer: Self-pay

## 2021-11-05 DIAGNOSIS — L509 Urticaria, unspecified: Secondary | ICD-10-CM

## 2021-11-05 MED ORDER — MONTELUKAST SODIUM 10 MG PO TABS: 10.0000 mg | ORAL_TABLET | Freq: Every day | ORAL | 1 refills | Status: DC

## 2021-11-06 ENCOUNTER — Ambulatory Visit: Payer: Medicare Other | Admitting: Dermatology

## 2021-11-06 ENCOUNTER — Other Ambulatory Visit: Payer: Self-pay

## 2021-11-06 DIAGNOSIS — L501 Idiopathic urticaria: Secondary | ICD-10-CM | POA: Diagnosis not present

## 2021-11-06 DIAGNOSIS — L509 Urticaria, unspecified: Secondary | ICD-10-CM

## 2021-11-06 MED ORDER — OMALIZUMAB 150 MG/ML ~~LOC~~ SOSY
150.0000 mg | PREFILLED_SYRINGE | Freq: Once | SUBCUTANEOUS | Status: AC
  Administered 2021-11-06: 12:00:00 150 mg via SUBCUTANEOUS

## 2021-11-06 NOTE — Progress Notes (Signed)
° °  Follow-Up Visit   Subjective  [name] is a 70 y.o. female who presents for the following: Follow-up (1 month follow up - Urticaria - stable - Xolair injections today. She has not had any flares in the last month.).  The following portions of the chart were reviewed this encounter and updated as appropriate:   Tobacco   Allergies   Meds   Problems   Med Hx   Surg Hx   Fam Hx      Review of Systems:  No other skin or systemic complaints except as noted in HPI or Assessment and Plan.  Objective  Well appearing patient in no apparent distress; mood and affect are within normal limits.  A focused examination was performed including face, arms. Relevant physical exam findings are noted in the Assessment and Plan.   Assessment & Plan  Urticaria  Urticaria -chronic idiopathic urticaria - Severe, but better controlled on Xolair. Patient is doing much better now  after severe flare requiring prednisone a couple months ago. No flare this past month and has been clear for at least a month.   BP before Xolair injection - 152/76 BP 15 mins after Xolair injections - 137/80 BP 30 mins after Xolair injections - 153/88   She will follow up with her allergist in [place] after today's appointment.  She has moved to the [place] area. She did have a virtual appointment with an allergist in [facility] who felt like she had an excellent evaluation and excellent care and he agreed with all currently being done for her at [facility].Marland Kitchen omalizumab Arvid Right) prefilled syringe 150 mg  Related Medications levocetirizine (XYZAL) 5 MG tablet TAKE 1 TABLET BY MOUTH EVERY DAY IN THE EVENING  montelukast (SINGULAIR) 10 MG tablet Take 1 tablet (10 mg total) by mouth daily.  Patient was evaluated and treated for at least 45 minutes today in the office. Return if symptoms worsen or fail to improve.  I, Ashok Cordia, CMA, am acting as scribe for Sarina Ser, MD . Documentation: I have reviewed the above documentation for accuracy and completeness, and I agree with the above.  Sarina Ser, MD

## 2021-11-06 NOTE — Patient Instructions (Signed)

## 2021-11-07 ENCOUNTER — Ambulatory Visit: Payer: Medicare Other | Admitting: Dermatology

## 2021-11-07 ENCOUNTER — Other Ambulatory Visit: Payer: Self-pay | Admitting: Internal Medicine

## 2021-11-11 ENCOUNTER — Encounter: Payer: Self-pay | Admitting: Dermatology

## 2021-11-21 ENCOUNTER — Encounter: Payer: Medicare Other | Admitting: Internal Medicine

## 2021-11-29 ENCOUNTER — Encounter: Payer: Medicare Other | Admitting: Internal Medicine

## 2022-03-31 ENCOUNTER — Other Ambulatory Visit: Payer: Self-pay | Admitting: Dermatology

## 2022-03-31 DIAGNOSIS — L509 Urticaria, unspecified: Secondary | ICD-10-CM

## 2022-05-12 ENCOUNTER — Other Ambulatory Visit: Payer: Self-pay | Admitting: Internal Medicine

## 2022-07-28 ENCOUNTER — Other Ambulatory Visit: Payer: Self-pay | Admitting: Internal Medicine

## 2022-09-26 ENCOUNTER — Other Ambulatory Visit: Payer: Self-pay | Admitting: Internal Medicine
